# Patient Record
Sex: Female | Born: 1965 | Race: White | Hispanic: No | Marital: Married | Smoking: Never smoker
Health system: Southern US, Community
[De-identification: ages and names within clinical notes are randomized; demographics above are authoritative.]

## PROBLEM LIST (undated history)

## (undated) DIAGNOSIS — I10 Essential (primary) hypertension: Secondary | ICD-10-CM

## (undated) DIAGNOSIS — E079 Disorder of thyroid, unspecified: Secondary | ICD-10-CM

## (undated) HISTORY — PX: ABDOMINAL HYSTERECTOMY: SHX81

---

## 1998-03-24 ENCOUNTER — Emergency Department (HOSPITAL_COMMUNITY): Admission: EM | Admit: 1998-03-24 | Discharge: 1998-03-24 | Payer: Self-pay | Admitting: Emergency Medicine

## 1999-02-13 ENCOUNTER — Emergency Department (HOSPITAL_COMMUNITY): Admission: EM | Admit: 1999-02-13 | Discharge: 1999-02-13 | Payer: Self-pay | Admitting: Emergency Medicine

## 1999-05-14 ENCOUNTER — Emergency Department (HOSPITAL_COMMUNITY): Admission: EM | Admit: 1999-05-14 | Discharge: 1999-05-14 | Payer: Self-pay | Admitting: Emergency Medicine

## 2000-10-21 ENCOUNTER — Other Ambulatory Visit: Admission: RE | Admit: 2000-10-21 | Discharge: 2000-10-21 | Payer: Self-pay | Admitting: *Deleted

## 2006-07-12 ENCOUNTER — Encounter (INDEPENDENT_AMBULATORY_CARE_PROVIDER_SITE_OTHER): Payer: Self-pay | Admitting: *Deleted

## 2006-07-12 ENCOUNTER — Observation Stay (HOSPITAL_COMMUNITY): Admission: RE | Admit: 2006-07-12 | Discharge: 2006-07-14 | Payer: Self-pay | Admitting: Gynecology

## 2010-01-12 ENCOUNTER — Ambulatory Visit (HOSPITAL_COMMUNITY): Admission: RE | Admit: 2010-01-12 | Discharge: 2010-01-12 | Payer: Self-pay | Admitting: Physician Assistant

## 2010-01-12 ENCOUNTER — Encounter: Payer: Self-pay | Admitting: Family Medicine

## 2010-12-20 ENCOUNTER — Emergency Department (HOSPITAL_BASED_OUTPATIENT_CLINIC_OR_DEPARTMENT_OTHER)
Admission: EM | Admit: 2010-12-20 | Discharge: 2010-12-21 | Disposition: A | Discharge status: Home / Self Care | Payer: BC Managed Care – PPO | Attending: Emergency Medicine | Admitting: Emergency Medicine

## 2010-12-20 DIAGNOSIS — E039 Hypothyroidism, unspecified: Secondary | ICD-10-CM | POA: Insufficient documentation

## 2010-12-20 DIAGNOSIS — A499 Bacterial infection, unspecified: Secondary | ICD-10-CM | POA: Insufficient documentation

## 2010-12-20 DIAGNOSIS — N760 Acute vaginitis: Secondary | ICD-10-CM | POA: Insufficient documentation

## 2010-12-20 DIAGNOSIS — B9689 Other specified bacterial agents as the cause of diseases classified elsewhere: Secondary | ICD-10-CM | POA: Insufficient documentation

## 2010-12-20 DIAGNOSIS — N9489 Other specified conditions associated with female genital organs and menstrual cycle: Secondary | ICD-10-CM | POA: Insufficient documentation

## 2010-12-20 LAB — CBC
MCV: 89.1 fL (ref 78.0–100.0)
Platelets: 332 10*3/uL (ref 150–400)
RDW: 13.9 % (ref 11.5–15.5)
WBC: 9.1 10*3/uL (ref 4.0–10.5)

## 2010-12-20 LAB — DIFFERENTIAL
Lymphs Abs: 3.5 10*3/uL (ref 0.7–4.0)
Monocytes Relative: 7 % (ref 3–12)
Neutrophils Relative %: 50 % (ref 43–77)

## 2010-12-21 ENCOUNTER — Emergency Department (INDEPENDENT_AMBULATORY_CARE_PROVIDER_SITE_OTHER): Payer: BC Managed Care – PPO

## 2010-12-21 DIAGNOSIS — R109 Unspecified abdominal pain: Secondary | ICD-10-CM

## 2010-12-21 LAB — WET PREP, GENITAL
Clue Cells Wet Prep HPF POC: NONE SEEN
Trich, Wet Prep: NONE SEEN
Yeast Wet Prep HPF POC: NONE SEEN

## 2010-12-21 LAB — URINALYSIS, ROUTINE W REFLEX MICROSCOPIC
Glucose, UA: NEGATIVE mg/dL
Protein, ur: NEGATIVE mg/dL
Specific Gravity, Urine: 1.028 (ref 1.005–1.030)
Urobilinogen, UA: 0.2 mg/dL (ref 0.0–1.0)
pH: 7 (ref 5.0–8.0)

## 2010-12-21 LAB — COMPREHENSIVE METABOLIC PANEL
Alkaline Phosphatase: 143 U/L — ABNORMAL HIGH (ref 39–117)
BUN: 12 mg/dL (ref 6–23)
Calcium: 8.9 mg/dL (ref 8.4–10.5)
Creatinine, Ser: 0.7 mg/dL (ref 0.4–1.2)
Glucose, Bld: 99 mg/dL (ref 70–99)
Total Protein: 7.1 g/dL (ref 6.0–8.3)

## 2010-12-21 LAB — URINE MICROSCOPIC-ADD ON

## 2010-12-22 LAB — GC/CHLAMYDIA PROBE AMP, GENITAL
Chlamydia, DNA Probe: NEGATIVE
GC Probe Amp, Genital: NEGATIVE

## 2011-01-09 NOTE — H&P (Signed)
NAMEHUMA, [name]              ACCOUNT NO.:  [id number]   MEDICAL RECORD NO.:  [id number]          PATIENT TYPE:  AMB   LOCATION:  [facility]                         FACILITY:  [facility]   PHYSICIAN:  [name], M.D.DATE OF BIRTH:  20-Jul-1966   DATE OF ADMISSION:  07/12/2006  DATE OF DISCHARGE:                                HISTORY & PHYSICAL   The patient is scheduled for surgery at [facility] Monday,  November 19th, at 7:30 a.m.   CHIEF COMPLAINT:  1. Chronic left lower quadrant pain.  2. History of ovarian cyst.   HISTORY:  The patient presented to the office for preoperative consultation  November 20th.  The patient is a 45 year old gravida 3, para 3, with chronic  pelvic pain, especially of the left lower quadrant.  She had a normal CA-125  that had been ordered back in September of 2007.  Due to the fact that on  the day when she presented to the office complaining of her pelvic pain she  was noted to have a right ovarian complex cyst on ultrasound measuring 16 x  14 x 15 mm with a cystic and solid area.  The left ovary had a thin wall  complex cyst measuring 35 x 19 x 25 mm and irregular border and on the  ultrasound July 05, 2006, the ultrasound had demonstrated the right  ovarian cyst had slightly increased to 24 x 21 x 17 mm with a thick wall  with solid cystic area with avascular flow in the perimeter of the cyst.  The left ovary was normal.  The patient stated that she also has  dyspareunia, her pain is always on her left side.  She was scheduled for a  diagnostic laparoscopy initially with bilateral ovarian cystectomy, but now  that the left ovarian cyst has resolved she states that regardless she would  like to have her left tube and ovary removed at the same time as her right  ovarian cystectomy.   PAST MEDICAL HISTORY:  1. She is ALLERGIC TO CODEINE AND PENICILLIN.  2. Has a prior history of transvaginal hysterectomy.  3. She has a history  of hypothyroidism for which she is on Synthroid.  4. She smokes 1/2 pack of cigarettes daily.   PHYSICAL EXAMINATION:  The patient weighs 161 pounds.  She is 5 feet tall.  Blood pressure 118/78.  HEENT:  Unremarkable.  NECK:  Supple.  Trachea midline.  No carotid bruits, no thyromegaly.  LUNGS:  Clear to auscultation without rhonchi or wheezes.  HEART:  Regular rate and rhythm, no murmurs or gallop.  BREAST:  Not done.  PELVIC:  __________ within normal limits.  Vaginal cuff intact.  BI-MANUAL:  Questionable fullness in the right adnexa.  RECTAL:  Not done.   ASSESSMENT:  95. A 45 year old gravida 3, para 3, with chronic pelvic pain, especially      the left lower quadrant.  2. Prior history of bilateral ovarian cysts.  3. Left ovarian cyst, resolved.  4. Persistent right ovarian cyst slightly larger, questionable dermoid.   Patient requesting, regardless, to have her  left tube and ovary removed  because of her chronic left lower quadrant pain regardless of the findings.  She will also undergo a right ovarian cystectomy.  The patient is fully  aware there is the possibility she could lose both ovaries at this point.  Patient would need to be placed on hormone replacement therapy.  Also, the  potential risk of trauma to internal organs or inaccessibility of the  structure due to extensive adhesions may require an open laparotomy  technique.  Also, with a risk of infection, although she will receive  prophylaxis antibiotic, the risk for DVT with subsequent pulmonary embolism  were discussed as well and she will have PSA stockings as well.  Also,  potential trauma to internal organs with the need for emergency laparotomy  and corrective surgery.  All of these issues were discussed with the patient  and all questions were answered and will follow accordingly.   PLAN:  The patient is scheduled for diagnostic laparoscopy with left  salpingo-oophorectomy and right ovarian cystectomy,  possible right salpingo-  oophorectomy as well.  The patient scheduled for surgery Monday, November  19th, at 7:30 a.m. at [facility].      [name], M.D.  Electronically Signed     JHF/MEDQ  D:  07/09/2006  T:  07/09/2006  Job:  [id number]

## 2011-01-09 NOTE — Op Note (Signed)
[name], [name]              ACCOUNT NO.:  [id number]   MEDICAL RECORD NO.:  [id number]          PATIENT TYPE:  AMB   LOCATION:  [facility]                         [facility]ACILITY:  [facility]   PHYSICIAN:  Juan H. Lily Peer, M.D.DATE OF BIRTH:  08/29/1965   DATE OF PROCEDURE:  07/12/2006  DATE OF DISCHARGE:                                 OPERATIVE REPORT   SURGEON:  Juan H. Lily Peer, M.D.   FIRST ASSISTANT:  Edyth Gunnels, MD   INDICATIONS FOR OPERATION:  45 year old gravida 3, para 3 with chronic  pelvic pain, left greater than right, history of ovarian cyst, right cyst  persistent, left cyst resolved.  The patient requesting removal of left tube  and ovary regardless of findings.   PREOPERATIVE DIAGNOSES:  1. Chronic left lower quadrant pain.  2. Ovarian cyst.   POSTOPERATIVE DIAGNOSES:  1. Chronic left lower quadrant pain.  2. Ovarian cyst.   ANESTHESIA:  General endotracheal anesthesia.   PROCEDURES PERFORMED:  1. Diagnostic laparoscopy.  2. Exploratory laparotomy.  3. Pelvic adhesiolysis.  4. Left salpingo-oophorectomy.   FINDINGS:  The patient had extensive omental adhesions during laparoscopy  making it impossible to proceed with the operation laparoscopically so  laparotomy had been done.  The patient had omentum adhered the anterior  abdominal wall and pelvic sidewalls that were evident.  Her left tube and  ovary were adhered to the pelvic sidewall, evidence of previous hysterectomy  and her right ovary had a small hemorrhagic cyst.   DESCRIPTION OF OPERATION:  After the patient was adequately counseled, she  was taken to the operating room where she underwent successful general  endotracheal anesthesia.  She received a gram of cefoxitin IV for  prophylaxis, had PSA stockings for DVT prophylaxis and a Foley catheter was  in place.  After abdomen was prepped and draped in usual sterile fashion, a  small stab incision was made under the umbilicus and with the OptiVu  and  direct visualization the peritoneal cavity was entered.  The laparoscope was  inserted and it was evident after the pneumoperitoneum was established there  was extensive abdominal pelvic adhesions and omentum adhered to the anterior  wall, making very small window to visualize pelvic cavity, it was decided to  proceed with a laparotomy technique.  The scope was removed.  The fascia  from the umbilicus was closed with 0 Vicryl suture and skin was  reapproximated with subcuticular stitch of 4-0 plain catgut suture.  The  Pfannenstiel skin incision was made 2 cm above the symphysis pubis.  The  incision was carried down through skin, subcutaneous tissue down to the  rectus fascia.  A midline nick was made.  The fascia was incised in  transverse fashion.  The peritoneal was entered cautiously and the Lenox Ahr was placed.  It was noted that omentum was adhered to the  anterior abdominal wall requiring meticulous dissection to free some of this  areas to be able to see the pelvic cavity.  Once this was accomplished, the  retractors were in place and the bowel was moved in a cephalic manner to be  able to visualize the pelvic cavity.  It was noted then the left tube and  ovary was adhered to the pelvic sidewall.  Meticulous dissection was  required to free the ovary from the pelvic sidewall.  The left ureter was  identified.  The round ligament was transected with a Bovie cautery and the  left infundibulopelvic limb was identified, was clamped, cut and free tied  with 0 Vicryl suture x2 and the remaining left tube and ovary was passed off  the operative field.  Surgicel was then used for additional hemostasis on  the peritoneum surface.  The right ovary was identified.  A small incision  was made over the capsule.  It appeared that what was being visualized on  the ultrasound was a hemorrhagic cyst.  No suspicious for malignancy.  Due  to some mild bleeding that was present,  running stitch of 4-0 Vicryl suture  in a locking stitch manner on the capsule was utilized for hemostasis and  Surgicel was used as well for hemostasis after the pelvic cavity had been  copiously irrigated with normal saline solution.  Sponge and needle count  were correct.  The O'Connor-O'Sullivan retractor was removed, the visceral  peritoneum was not reapproximated but the fascia was closed with running  stitch of 0 Vicryl suture.  The subcutaneous bleeders were Bovie cauterized.  Skin was reapproximated with skin clips followed by placing Xeroform gauze  and 4 x dressing.  Of note quarter percent Marcaine was infiltrated the  subumbilical and Pfannenstiel incision site for postoperative analgesia for  a total of 10 mL.  Blood loss was less than 100 mL and IV fluid 1800 mL of  lactated Ringer's.      Juan H. Lily Peer, M.D.  Electronically Signed     JHF/MEDQ  D:  07/12/2006  T:  07/12/2006  Job:  [id number]

## 2011-01-09 NOTE — Discharge Summary (Signed)
NAMEJANYIA, [name]              ACCOUNT NO.:  [id number]   MEDICAL RECORD NO.:  [id number]          PATIENT TYPE:  INP   LOCATION:  1506                         FACILITY:  [facility]   PHYSICIAN:  Juan H. Lily Peer, M.D.DATE OF BIRTH:  12/27/1965   DATE OF ADMISSION:  07/12/2006  DATE OF DISCHARGE:  07/14/2006                                 DISCHARGE SUMMARY   Total days hospitalized were two.   HISTORY:  The patient is a 45 year old gravida 3, para 3 with chronic left  lower quadrant pain.  Was taken to the operating room on the morning of  November 19th for diagnostic laparoscopy resulting in exploratory laparotomy  secondary to extensive pelvic adhesions.  She underwent pelvic adhesiolysis  and left-sided salpingo-oophorectomy.  She had a right small hemorrhagic  cyst, but the rest of the pelvic cavity was normal.  She had a previous  history of hysterectomy for benign entity.   Her postoperative stay consisted of a hemoglobin and hematocrit on the first  postoperative day of 12.9 and 36.5 respectively.  Adequate urine output.  Foley catheter was discontinued for 24 hours, status post PCA pump.  She was  advanced from a clear-liquid diet to a regular diet.  She was up and  ambulating and passing gas and was ready to be discharged home on her second  postoperative day.   FINAL DIAGNOSES:  1. Chronic left lower quadrant pain.  2. Pelvic adhesions.   PROCEDURES PERFORMED:  1. Diagnostic laparoscopy.  2. Exploratory laparotomy.  3. Pelvic adhesiolysis.  4. Left salpingo-oophorectomy.   FINAL DISPOSITION/FOLLOWUP:  Patient was discharged home on her second  postoperative day.  She was to return back to the office in a few days to  have her staples removed.  She was given a prescription for Darvocet to take  1 p.o. q.4-6h. p.r.n. pain and Reglan 10 mg 1 p.o. q.4-6h. p.r.n. nausea.      Juan H. Lily Peer, M.D.  Electronically Signed     JHF/MEDQ  D:  07/14/2006  T:   07/14/2006  Job:  [id number]

## 2012-12-28 ENCOUNTER — Encounter (HOSPITAL_BASED_OUTPATIENT_CLINIC_OR_DEPARTMENT_OTHER): Payer: Self-pay | Admitting: *Deleted

## 2012-12-28 ENCOUNTER — Emergency Department (HOSPITAL_BASED_OUTPATIENT_CLINIC_OR_DEPARTMENT_OTHER)
Admission: EM | Admit: 2012-12-28 | Discharge: 2012-12-28 | Disposition: A | Discharge status: Home / Self Care | Payer: BC Managed Care – PPO | Attending: Emergency Medicine | Admitting: Emergency Medicine

## 2012-12-28 DIAGNOSIS — N390 Urinary tract infection, site not specified: Secondary | ICD-10-CM

## 2012-12-28 DIAGNOSIS — E079 Disorder of thyroid, unspecified: Secondary | ICD-10-CM | POA: Insufficient documentation

## 2012-12-28 DIAGNOSIS — Z880 Allergy status to penicillin: Secondary | ICD-10-CM | POA: Insufficient documentation

## 2012-12-28 DIAGNOSIS — Z79899 Other long term (current) drug therapy: Secondary | ICD-10-CM | POA: Insufficient documentation

## 2012-12-28 DIAGNOSIS — I10 Essential (primary) hypertension: Secondary | ICD-10-CM | POA: Insufficient documentation

## 2012-12-28 HISTORY — DX: Disorder of thyroid, unspecified: E07.9

## 2012-12-28 HISTORY — DX: Essential (primary) hypertension: I10

## 2012-12-28 LAB — URINALYSIS, ROUTINE W REFLEX MICROSCOPIC
Bilirubin Urine: NEGATIVE
Nitrite: NEGATIVE
Specific Gravity, Urine: 1.025 (ref 1.005–1.030)
Urobilinogen, UA: 0.2 mg/dL (ref 0.0–1.0)

## 2012-12-28 LAB — URINE MICROSCOPIC-ADD ON

## 2012-12-28 MED ORDER — NITROFURANTOIN MONOHYD MACRO 100 MG PO CAPS: 100.0000 mg | ORAL_CAPSULE | Freq: Two times a day (BID) | ORAL | Status: DC

## 2012-12-28 MED ORDER — OXYCODONE-ACETAMINOPHEN 5-325 MG PO TABS
1.0000 | ORAL_TABLET | Freq: Once | ORAL | Status: AC
  Administered 2012-12-28: 1 via ORAL
  Filled 2012-12-28 (×2): qty 1

## 2012-12-28 MED ORDER — OXYCODONE-ACETAMINOPHEN 5-325 MG PO TABS: 2.0000 | ORAL_TABLET | ORAL | Status: DC | PRN

## 2012-12-28 NOTE — ED Provider Notes (Signed)
Medical screening examination/treatment/procedure(s) were performed by non-physician practitioner and as supervising physician I was immediately available for consultation/collaboration.  Melani Brisbane, MD 12/28/12 1807 

## 2012-12-28 NOTE — ED Provider Notes (Signed)
History     CSN: [id number]  Arrival date & time 12/28/12  1527   First MD Initiated Contact with Patient 12/28/12 1544      Chief Complaint  Patient presents with  . Hematuria    (Consider location/radiation/quality/duration/timing/severity/associated sxs/prior treatment) HPI Comments: Pt states that she is having blood in urine:no discharge:pt states that the urine has an odor  Patient is a 47 y.o. female presenting with hematuria. The history is provided by the patient. No language interpreter was used.  Hematuria This is a new problem. The current episode started in the past 7 days. The problem occurs constantly. The problem has been gradually worsening. Associated symptoms include urinary symptoms. Pertinent negatives include no abdominal pain, fever or vomiting. Nothing aggravates the symptoms. She has tried nothing for the symptoms.    Past Medical History  Diagnosis Date  . Hypertension   . Thyroid disease     Past Surgical History  Procedure Laterality Date  . Abdominal hysterectomy      History reviewed. No pertinent family history.  History  Substance Use Topics  . Smoking status: Never Smoker   . Smokeless tobacco: Not on file  . Alcohol Use: No    OB History   Grav Para Term Preterm Abortions TAB SAB Ect Mult Living                  Review of Systems  Constitutional: Negative for fever.  Respiratory: Negative.   Cardiovascular: Negative.   Gastrointestinal: Negative for vomiting and abdominal pain.  Genitourinary: Positive for hematuria.    Allergies  Penicillins and Codeine  Home Medications   Current Outpatient Rx  Name  Route  Sig  Dispense  Refill  . levothyroxine (SYNTHROID, LEVOTHROID) 125 MCG tablet   Oral   Take 125 mcg by mouth daily before breakfast.         . rosuvastatin (CRESTOR) 20 MG tablet   Oral   Take 20 mg by mouth daily.         . nitrofurantoin, macrocrystal-monohydrate, (MACROBID) 100 MG capsule   Oral  Take 1 capsule (100 mg total) by mouth 2 (two) times daily.   14 capsule   0   . oxyCODONE-acetaminophen (PERCOCET/ROXICET) 5-325 MG per tablet   Oral   Take 2 tablets by mouth every 4 (four) hours as needed for pain.   6 tablet   0     BP 178/91  Pulse 96  Temp(Src) 98.2 F (36.8 C) (Oral)  Resp 18  Ht 5' (1.524 m)  Wt 188 lb (85.276 kg)  BMI 36.72 kg/m2  SpO2 95%  Physical Exam  Nursing note and vitals reviewed. Constitutional: She is oriented to person, place, and time. She appears well-developed and well-nourished.  Cardiovascular: Normal rate and regular rhythm.   Pulmonary/Chest: Effort normal and breath sounds normal.  Abdominal: Soft. Bowel sounds are normal. There is no tenderness.  Musculoskeletal: Normal range of motion.  Neurological: She is alert and oriented to person, place, and time.  Skin: Skin is warm and dry.  Psychiatric: She has a normal mood and affect.    ED Course  Procedures (including critical care time)  Labs Reviewed  URINALYSIS, ROUTINE W REFLEX MICROSCOPIC - Abnormal; Notable for the following:    APPearance CLOUDY (*)    Leukocytes, UA SMALL (*)    All other components within normal limits  URINE MICROSCOPIC-ADD ON - Abnormal; Notable for the following:    Squamous Epithelial / LPF FEW (*)  All other components within normal limits  URINE CULTURE   No results found.   1. UTI (lower urinary tract infection)       MDM  Will treat for a simple WUJ:[id number] sent        Teressa Lower, NP 12/28/12 1614

## 2012-12-28 NOTE — ED Notes (Signed)
Pt c/o lower abd pain with blood in urine  X 1 week

## 2012-12-31 LAB — URINE CULTURE

## 2013-01-01 ENCOUNTER — Telehealth (HOSPITAL_COMMUNITY): Payer: Self-pay | Admitting: Emergency Medicine

## 2013-01-01 NOTE — ED Notes (Signed)
Post ED Visit - Positive Culture Follow-up  Culture report reviewed by antimicrobial stewardship pharmacist: []  Wes Dulaney, Pharm.D., BCPS []  Celedonio Miyamoto, Pharm.D., BCPS [x]  Georgina Pillion, Pharm.D., BCPS []  [place]ace, [address].D., BCPS, AAHIVP []  Estella Husk, Pharm.D., BCPS, AAHIV  Positive urine culture Treated with Macrobid, organism sensitive to the same and no further patient follow-up is required at this time.  Kylie A Holland 01/01/2013, 4:25 PM

## 2017-02-17 ENCOUNTER — Other Ambulatory Visit: Payer: Self-pay | Admitting: Student

## 2017-02-17 DIAGNOSIS — Z1239 Encounter for other screening for malignant neoplasm of breast: Secondary | ICD-10-CM

## 2020-05-06 ENCOUNTER — Encounter (HOSPITAL_BASED_OUTPATIENT_CLINIC_OR_DEPARTMENT_OTHER): Payer: Self-pay

## 2020-05-06 ENCOUNTER — Emergency Department (HOSPITAL_BASED_OUTPATIENT_CLINIC_OR_DEPARTMENT_OTHER): Payer: Managed Care, Other (non HMO)

## 2020-05-06 ENCOUNTER — Emergency Department (HOSPITAL_BASED_OUTPATIENT_CLINIC_OR_DEPARTMENT_OTHER)
Admission: EM | Admit: 2020-05-06 | Discharge: 2020-05-06 | Disposition: A | Discharge status: Home / Self Care | Payer: Managed Care, Other (non HMO) | Attending: Emergency Medicine | Admitting: Emergency Medicine

## 2020-05-06 ENCOUNTER — Other Ambulatory Visit: Payer: Self-pay

## 2020-05-06 DIAGNOSIS — Z79899 Other long term (current) drug therapy: Secondary | ICD-10-CM | POA: Diagnosis not present

## 2020-05-06 DIAGNOSIS — I10 Essential (primary) hypertension: Secondary | ICD-10-CM | POA: Insufficient documentation

## 2020-05-06 DIAGNOSIS — U071 COVID-19: Secondary | ICD-10-CM | POA: Diagnosis not present

## 2020-05-06 DIAGNOSIS — R05 Cough: Secondary | ICD-10-CM | POA: Diagnosis present

## 2020-05-06 LAB — BASIC METABOLIC PANEL
Anion gap: 13 (ref 5–15)
BUN: 16 mg/dL (ref 6–20)
CO2: 23 mmol/L (ref 22–32)
Calcium: 9.2 mg/dL (ref 8.9–10.3)
Chloride: 101 mmol/L (ref 98–111)
Creatinine, Ser: 0.88 mg/dL (ref 0.44–1.00)
GFR calc Af Amer: >60 mL/min (ref >60)
GFR calc non Af Amer: >60 mL/min (ref >60)
Glucose, Bld: 102 mg/dL — ABNORMAL HIGH (ref 70–99)
Potassium: 3.4 mmol/L — ABNORMAL LOW (ref 3.5–5.1)
Sodium: 137 mmol/L (ref 135–145)

## 2020-05-06 LAB — CBC WITH DIFFERENTIAL/PLATELET
Abs Immature Granulocytes: 0.01 10*3/uL (ref 0.00–0.07)
Basophils Absolute: 0 10*3/uL (ref 0.0–0.1)
Basophils Relative: 0 %
Eosinophils Absolute: 0 10*3/uL (ref 0.0–0.5)
Eosinophils Relative: 1 %
HCT: 42.3 % (ref 36.0–46.0)
Hemoglobin: 14.4 g/dL (ref 12.0–15.0)
Immature Granulocytes: 0 %
Lymphocytes Relative: 30 %
Lymphs Abs: 1.2 10*3/uL (ref 0.7–4.0)
MCH: 30.8 pg (ref 26.0–34.0)
MCHC: 34 g/dL (ref 30.0–36.0)
MCV: 90.6 fL (ref 80.0–100.0)
Monocytes Absolute: 0.3 10*3/uL (ref 0.1–1.0)
Monocytes Relative: 7 %
Neutro Abs: 2.5 10*3/uL (ref 1.7–7.7)
Neutrophils Relative %: 62 %
Platelets: 241 10*3/uL (ref 150–400)
RBC: 4.67 MIL/uL (ref 3.87–5.11)
RDW: 13.9 % (ref 11.5–15.5)
Smear Review: NORMAL
WBC: 4.1 10*3/uL (ref 4.0–10.5)
nRBC: 0 % (ref 0.0–0.2)

## 2020-05-06 LAB — SARS CORONAVIRUS 2 BY RT PCR (HOSPITAL ORDER, PERFORMED IN ~~LOC~~ HOSPITAL LAB): SARS Coronavirus 2: POSITIVE — AB

## 2020-05-06 MED ORDER — DIPHENHYDRAMINE HCL 50 MG/ML IJ SOLN: 50.0000 mg | Freq: Once | INTRAMUSCULAR | Status: DC | PRN

## 2020-05-06 MED ORDER — BENZONATATE 100 MG PO CAPS: 100.0000 mg | ORAL_CAPSULE | Freq: Three times a day (TID) | ORAL | 0 refills | Status: AC

## 2020-05-06 MED ORDER — ALBUTEROL SULFATE HFA 108 (90 BASE) MCG/ACT IN AERS
2.0000 | INHALATION_SPRAY | Freq: Once | RESPIRATORY_TRACT | Status: AC
  Administered 2020-05-06: 2 via RESPIRATORY_TRACT
  Filled 2020-05-06: qty 6.7

## 2020-05-06 MED ORDER — EPINEPHRINE 0.3 MG/0.3ML IJ SOAJ: 0.3000 mg | Freq: Once | INTRAMUSCULAR | Status: DC | PRN

## 2020-05-06 MED ORDER — FLUTICASONE PROPIONATE 50 MCG/ACT NA SUSP: 2.0000 | Freq: Every day | NASAL | 0 refills | Status: DC

## 2020-05-06 MED ORDER — BENZONATATE 100 MG PO CAPS: 100.0000 mg | ORAL_CAPSULE | Freq: Three times a day (TID) | ORAL | 0 refills | Status: DC

## 2020-05-06 MED ORDER — METHYLPREDNISOLONE SODIUM SUCC 125 MG IJ SOLR: 125.0000 mg | Freq: Once | INTRAMUSCULAR | Status: DC | PRN

## 2020-05-06 MED ORDER — IOHEXOL 350 MG/ML SOLN
100.0000 mL | Freq: Once | INTRAVENOUS | Status: AC | PRN
  Administered 2020-05-06: 100 mL via INTRAVENOUS

## 2020-05-06 MED ORDER — SODIUM CHLORIDE 0.9 % IV SOLN: INTRAVENOUS | Status: DC | PRN

## 2020-05-06 MED ORDER — ALBUTEROL SULFATE HFA 108 (90 BASE) MCG/ACT IN AERS: 2.0000 | INHALATION_SPRAY | Freq: Once | RESPIRATORY_TRACT | Status: DC | PRN

## 2020-05-06 MED ORDER — FAMOTIDINE IN NACL 20-0.9 MG/50ML-% IV SOLN: 20.0000 mg | Freq: Once | INTRAVENOUS | Status: DC | PRN

## 2020-05-06 MED ORDER — SODIUM CHLORIDE 0.9 % IV SOLN
1200.0000 mg | Freq: Once | INTRAVENOUS | Status: AC
  Administered 2020-05-06: 1200 mg via INTRAVENOUS
  Filled 2020-05-06: qty 10

## 2020-05-06 NOTE — ED Triage Notes (Signed)
Pt c/o flu sx started 9/8-states she had covid test yesterday with results pending-NAD-steady gait

## 2020-05-06 NOTE — Discharge Instructions (Addendum)
Take 2 puffs of the albuterol inhaler as needed every 4-6 hours.  You are also given fluticasone nasal spray for your congestion and Tessalon Perles for your cough.  Use as directed.  Please follow up with your primary care provider within 5-7 days for re-evaluation of your symptoms. If you do not have a primary care provider, information for a healthcare clinic has been provided for you to make arrangements for follow up care. Please return to the emergency department for any new or worsening symptoms.

## 2020-05-06 NOTE — ED Provider Notes (Signed)
[facility] EMERGENCY DEPARTMENT Provider Note   CSN: [id number] Arrival date & time: 05/06/20  1222     History Chief Complaint  Patient presents with  . Cough    + covid    [name] is a 53 y.o. female.  HPI   54 year old female with a history of hypertension, thyroid disease, who presents to the emergency department today for evaluation of URI systems.  Patient states that about a week ago she started having congestion, cough, nausea, diarrhea, and loss of taste and smell.  She also has started experiencing some shortness of breath over the last few days.  She has been taking over-the-counter medications to help with her symptoms.  She has not been vaccinated.  Past Medical History:  Diagnosis Date  . Hypertension   . Thyroid disease     There are no problems to display for this patient.   Past Surgical History:  Procedure Laterality Date  . ABDOMINAL HYSTERECTOMY       OB History   No obstetric history on file.     No family history on file.  Social History   Tobacco Use  . Smoking status: Never Smoker  . Smokeless tobacco: Never Used  Vaping Use  . Vaping Use: Never used  Substance Use Topics  . Alcohol use: No  . Drug use: No    Home Medications Prior to Admission medications   Medication Sig Start Date End Date Taking? Authorizing Provider  benzonatate (TESSALON) 100 MG capsule Take 1 capsule (100 mg total) by mouth every 8 (eight) hours for 5 days. 05/06/20 05/11/20  Lukus Binion S, PA-C  fluticasone (FLONASE) 50 MCG/ACT nasal spray Place 2 sprays into both nostrils daily. 05/06/20   Blondine Hottel S, PA-C  levothyroxine (SYNTHROID, LEVOTHROID) 125 MCG tablet Take 125 mcg by mouth daily before breakfast.    [provider]  nitrofurantoin, macrocrystal-monohydrate, (MACROBID) 100 MG capsule Take 1 capsule (100 mg total) by mouth 2 (two) times daily. 12/28/12   Teressa Lower, NP  oxyCODONE-acetaminophen  (PERCOCET/ROXICET) 5-325 MG per tablet Take 2 tablets by mouth every 4 (four) hours as needed for pain. 12/28/12   Teressa Lower, NP  rosuvastatin (CRESTOR) 20 MG tablet Take 20 mg by mouth daily.    [provider]    Allergies    Penicillins and Codeine  Review of Systems   Review of Systems  Constitutional: Negative for fever.  HENT: Positive for congestion. Negative for ear pain and sore throat.        Loss of taste and smell  Eyes: Negative for visual disturbance.  Respiratory: Positive for cough and shortness of breath.   Cardiovascular: Negative for chest pain and palpitations.  Gastrointestinal: Positive for diarrhea and nausea. Negative for abdominal pain and vomiting.  Genitourinary: Negative for dysuria and hematuria.  Musculoskeletal: Positive for myalgias.  Skin: Negative for rash.  Neurological: Positive for headaches.  All other systems reviewed and are negative.   Physical Exam Updated Vital Signs BP 139/81 (BP Location: Right Arm)   Pulse 99   Temp 99 F (37.2 C) (Oral)   Resp 16   Ht 5' (1.524 m)   Wt 83.5 kg   SpO2 99%   BMI 35.94 kg/m   Physical Exam Vitals and nursing note reviewed.  Constitutional:      General: She is not in acute distress.    Appearance: She is well-developed.  HENT:     Head: Normocephalic and atraumatic.  Eyes:  Conjunctiva/sclera: Conjunctivae normal.  Cardiovascular:     Rate and Rhythm: Normal rate and regular rhythm.     Heart sounds: Normal heart sounds. No murmur heard.   Pulmonary:     Effort: Pulmonary effort is normal. No respiratory distress.     Breath sounds: Normal breath sounds. No wheezing or rales.  Abdominal:     General: Bowel sounds are normal.     Palpations: Abdomen is soft.     Tenderness: There is no abdominal tenderness. There is no guarding or rebound.  Musculoskeletal:     Cervical back: Neck supple.  Skin:    General: Skin is warm and dry.  Neurological:     Mental Status:  She is alert.     ED Results / Procedures / Treatments   Labs (all labs ordered are listed, but only abnormal results are displayed) Labs Reviewed  SARS CORONAVIRUS 2 BY RT PCR (HOSPITAL ORDER, PERFORMED IN [facility]) - Abnormal; Notable for the following components:      Result Value   SARS Coronavirus 2 POSITIVE (*)    All other components within normal limits  BASIC METABOLIC PANEL - Abnormal; Notable for the following components:   Potassium 3.4 (*)    Glucose, Bld 102 (*)    All other components within normal limits  CBC WITH DIFFERENTIAL/PLATELET    EKG None  Radiology CT Angio Chest PE W and/or Wo Contrast  Result Date: 05/06/2020 CLINICAL DATA:  COVID-19 positive.  Possible pulmonary embolus. EXAM: CT ANGIOGRAPHY CHEST WITH CONTRAST TECHNIQUE: Multidetector CT imaging of the chest was performed using the standard protocol during bolus administration of intravenous contrast. Multiplanar CT image reconstructions and MIPs were obtained to evaluate the vascular anatomy. CONTRAST:  OMNIPAQUE IOHEXOL 350 MG/ML SOLN COMPARISON:  Radiograph same day. FINDINGS: Cardiovascular: Satisfactory opacification of the pulmonary arteries to the segmental level. No evidence of pulmonary embolism. Normal heart size. No pericardial effusion. Mediastinum/Nodes: No enlarged mediastinal, hilar, or axillary lymph nodes. Thyroid gland, trachea, and esophagus demonstrate no significant findings. Lungs/Pleura: No pneumothorax or pleural effusion is noted. Patchy airspace opacities are noted throughout both lungs concerning for multifocal pneumonia, most likely due to COVID-19. Upper Abdomen: No acute abnormality. Musculoskeletal: No chest wall abnormality. No acute or significant osseous findings. Review of the MIP images confirms the above findings. IMPRESSION: 1. No definite evidence of pulmonary embolus. 2. Patchy airspace opacities are noted throughout both lungs concerning for  multifocal pneumonia, most likely due to COVID-19. Electronically Signed   By: Lupita Raider M.D.   On: 05/06/2020 18:12   DG Chest Portable 1 View  Result Date: 05/06/2020 CLINICAL DATA:  Cough. EXAM: PORTABLE CHEST 1 VIEW COMPARISON:  None. FINDINGS: The heart size and mediastinal contours are within normal limits. A possible 3.7 cm airspace opacity is seen in the right upper lung. The left lung is clear. There is no pleural effusion or pneumothorax. The visualized skeletal structures are unremarkable. IMPRESSION: Possible airspace opacity in the right upper lung may represent a focal infiltrate versus pulmonary nodule/mass. Chest CT could be performed for further evaluation. Electronically Signed   By: Romona Curls M.D.   On: 05/06/2020 15:12    Procedures Procedures (including critical care time)  Medications Ordered in ED Medications  0.9 %  sodium chloride infusion (has no administration in time range)  diphenhydrAMINE (BENADRYL) injection 50 mg (has no administration in time range)  famotidine (PEPCID) IVPB 20 mg premix (has no administration in time  range)  methylPREDNISolone sodium succinate (SOLU-MEDROL) 125 mg/2 mL injection 125 mg (has no administration in time range)  albuterol (VENTOLIN HFA) 108 (90 Base) MCG/ACT inhaler 2 puff (has no administration in time range)  EPINEPHrine (EPI-PEN) injection 0.3 mg (has no administration in time range)  albuterol (VENTOLIN HFA) 108 (90 Base) MCG/ACT inhaler 2 puff (2 puffs Inhalation Given 05/06/20 1705)  casirivimab-imdevimab (REGEN-COV) 1,200 mg in sodium chloride 0.9 % 110 mL IVPB (0 mg Intravenous Stopped 05/06/20 1718)  iohexol (OMNIPAQUE) 350 MG/ML injection 100 mL (100 mLs Intravenous Contrast Given 05/06/20 1741)    ED Course  I have reviewed the triage vital signs and the nursing notes.  Pertinent labs & imaging results that were available during my care of the patient were reviewed by me and considered in my medical decision  making (see chart for details).    MDM Rules/Calculators/A&P                          53 year old female presenting for evaluation of URI symptoms for the last week.  Is complaining of shortness of breath now as well.  Patient satting well on room air on my evaluation.  I personally ambulated her in the room and sats did not drop below 95%.  Her exam is reassuring.  Covid test is positive.  Chest x-ray reviewed/interpreted which shows   abnormality to the right upper lobe concerning for possible infiltrate versus mass.  Will obtain labs and CTA of the chest for further evaluation.    CTA chest reviewed/interpreted -  1. No definite evidence of pulmonary embolus. 2. Patchy airspace opacities are noted throughout both lungs concerning for multifocal pneumonia, most likely due to COVID-19.   She does have a history of obesity and qualifies for Mab infusion. after discussion of risk/benefits patient consented to infusion which she tolerated without difficulty or complaint.   Will give rx for symptomatic meds for her covid. Advised close f/u with pcp and strict return precautions. She voices understanding of the plan and reasons to return. All questions answered, pt stable for discharge.   [name] was evaluated in Emergency Department on 05/06/2020 for the symptoms described in the history of present illness. She was evaluated in the context of the global COVID-19 pandemic, which necessitated consideration that the patient might be at risk for infection with the SARS-CoV-2 virus that causes COVID-19. Institutional protocols and algorithms that pertain to the evaluation of patients at risk for COVID-19 are in a state of rapid change based on information released by regulatory bodies including the CDC and federal and state organizations. These policies and algorithms were followed during the patient's care in the ED.   Final Clinical Impression(s) / ED Diagnoses Final diagnoses:  COVID-19     Rx / DC Orders ED Discharge Orders         Ordered    fluticasone (FLONASE) 50 MCG/ACT nasal spray  Daily        05/06/20 1752    benzonatate (TESSALON) 100 MG capsule  Every 8 hours        05/06/20 1752           Abhishek Levesque S, PA-C 05/06/20 1826    Sabas Sous, MD 05/06/20 2318

## 2020-05-06 NOTE — ED Notes (Signed)
Denies any discomfort from mAb infusion.

## 2020-10-14 ENCOUNTER — Observation Stay (HOSPITAL_BASED_OUTPATIENT_CLINIC_OR_DEPARTMENT_OTHER)
Admission: EM | Admit: 2020-10-14 | Discharge: 2020-10-16 | Disposition: A | Discharge status: Home / Self Care | Payer: Managed Care, Other (non HMO) | Attending: Family Medicine | Admitting: Family Medicine

## 2020-10-14 ENCOUNTER — Other Ambulatory Visit: Payer: Self-pay

## 2020-10-14 ENCOUNTER — Encounter (HOSPITAL_BASED_OUTPATIENT_CLINIC_OR_DEPARTMENT_OTHER): Payer: Self-pay | Admitting: Radiology

## 2020-10-14 ENCOUNTER — Emergency Department (HOSPITAL_BASED_OUTPATIENT_CLINIC_OR_DEPARTMENT_OTHER): Payer: Managed Care, Other (non HMO)

## 2020-10-14 DIAGNOSIS — E876 Hypokalemia: Secondary | ICD-10-CM | POA: Insufficient documentation

## 2020-10-14 DIAGNOSIS — Z79899 Other long term (current) drug therapy: Secondary | ICD-10-CM | POA: Insufficient documentation

## 2020-10-14 DIAGNOSIS — E785 Hyperlipidemia, unspecified: Secondary | ICD-10-CM | POA: Diagnosis present

## 2020-10-14 DIAGNOSIS — H538 Other visual disturbances: Principal | ICD-10-CM | POA: Insufficient documentation

## 2020-10-14 DIAGNOSIS — G459 Transient cerebral ischemic attack, unspecified: Secondary | ICD-10-CM | POA: Diagnosis present

## 2020-10-14 DIAGNOSIS — R519 Headache, unspecified: Secondary | ICD-10-CM | POA: Diagnosis not present

## 2020-10-14 DIAGNOSIS — E039 Hypothyroidism, unspecified: Secondary | ICD-10-CM | POA: Diagnosis not present

## 2020-10-14 DIAGNOSIS — H5500 Unspecified nystagmus: Secondary | ICD-10-CM

## 2020-10-14 DIAGNOSIS — Z20822 Contact with and (suspected) exposure to covid-19: Secondary | ICD-10-CM | POA: Insufficient documentation

## 2020-10-14 LAB — CBC WITH DIFFERENTIAL/PLATELET
Abs Immature Granulocytes: 0.01 10*3/uL (ref 0.00–0.07)
Basophils Absolute: 0 10*3/uL (ref 0.0–0.1)
Basophils Relative: 0 %
Eosinophils Absolute: 0.4 10*3/uL (ref 0.0–0.5)
Eosinophils Relative: 4 %
HCT: 42.8 % (ref 36.0–46.0)
Hemoglobin: 14.1 g/dL (ref 12.0–15.0)
Immature Granulocytes: 0 %
Lymphocytes Relative: 39 %
Lymphs Abs: 3.7 10*3/uL (ref 0.7–4.0)
MCH: 30.7 pg (ref 26.0–34.0)
MCHC: 32.9 g/dL (ref 30.0–36.0)
MCV: 93.2 fL (ref 80.0–100.0)
Monocytes Absolute: 0.6 10*3/uL (ref 0.1–1.0)
Monocytes Relative: 6 %
Neutro Abs: 4.9 10*3/uL (ref 1.7–7.7)
Neutrophils Relative %: 51 %
Platelets: 331 10*3/uL (ref 150–400)
RBC: 4.59 MIL/uL (ref 3.87–5.11)
RDW: 14.2 % (ref 11.5–15.5)
WBC: 9.6 10*3/uL (ref 4.0–10.5)
nRBC: 0 % (ref 0.0–0.2)

## 2020-10-14 LAB — RAPID URINE DRUG SCREEN, HOSP PERFORMED
Amphetamines: NOT DETECTED
Barbiturates: NOT DETECTED
Benzodiazepines: NOT DETECTED
Cocaine: NOT DETECTED
Opiates: NOT DETECTED
Tetrahydrocannabinol: NOT DETECTED

## 2020-10-14 LAB — URINALYSIS, ROUTINE W REFLEX MICROSCOPIC
Bilirubin Urine: NEGATIVE
Glucose, UA: NEGATIVE mg/dL
Hgb urine dipstick: NEGATIVE
Ketones, ur: NEGATIVE mg/dL
Leukocytes,Ua: NEGATIVE
Nitrite: NEGATIVE
Protein, ur: NEGATIVE mg/dL
Specific Gravity, Urine: 1.015 (ref 1.005–1.030)
pH: 6 (ref 5.0–8.0)

## 2020-10-14 LAB — PROTIME-INR
INR: 1 (ref 0.8–1.2)
Prothrombin Time: 13.2 seconds (ref 11.4–15.2)

## 2020-10-14 LAB — BASIC METABOLIC PANEL
Anion gap: 9 (ref 5–15)
BUN: 11 mg/dL (ref 6–20)
CO2: 25 mmol/L (ref 22–32)
Calcium: 9.3 mg/dL (ref 8.9–10.3)
Chloride: 106 mmol/L (ref 98–111)
Creatinine, Ser: 0.74 mg/dL (ref 0.44–1.00)
GFR, Estimated: >60 mL/min (ref >60)
Glucose, Bld: 93 mg/dL (ref 70–99)
Potassium: 3.1 mmol/L — ABNORMAL LOW (ref 3.5–5.1)
Sodium: 140 mmol/L (ref 135–145)

## 2020-10-14 LAB — APTT: aPTT: 34 seconds (ref 24–36)

## 2020-10-14 LAB — RESP PANEL BY RT-PCR (FLU A&B, COVID) ARPGX2
Influenza A by PCR: NEGATIVE
Influenza B by PCR: NEGATIVE
SARS Coronavirus 2 by RT PCR: NEGATIVE

## 2020-10-14 LAB — PREGNANCY, URINE: Preg Test, Ur: NEGATIVE

## 2020-10-14 LAB — ETHANOL: Alcohol, Ethyl (B): <10 mg/dL (ref <10)

## 2020-10-14 MED ORDER — IOHEXOL 350 MG/ML SOLN
100.0000 mL | Freq: Once | INTRAVENOUS | Status: AC | PRN
  Administered 2020-10-14: 100 mL via INTRAVENOUS

## 2020-10-14 MED ORDER — MECLIZINE HCL 25 MG PO TABS: 25.0000 mg | ORAL_TABLET | Freq: Once | ORAL | Status: DC

## 2020-10-14 MED ORDER — ONDANSETRON HCL 4 MG/2ML IJ SOLN
4.0000 mg | Freq: Once | INTRAMUSCULAR | Status: AC
  Administered 2020-10-14: 4 mg via INTRAVENOUS
  Filled 2020-10-14: qty 2

## 2020-10-14 MED ORDER — ASPIRIN 81 MG PO CHEW
324.0000 mg | CHEWABLE_TABLET | Freq: Once | ORAL | Status: AC
  Administered 2020-10-14: 324 mg via ORAL
  Filled 2020-10-14: qty 4

## 2020-10-14 NOTE — ED Notes (Signed)
Patient given crackers and soda of choice. Denies needs at this time. No acute distress noted.

## 2020-10-14 NOTE — ED Notes (Signed)
Report called to Company secretary at [facility].

## 2020-10-14 NOTE — ED Provider Notes (Signed)
[facility] EMERGENCY DEPARTMENT Provider Note   CSN: [id number] Arrival date & time: 10/14/20  1719     History Chief Complaint  Patient presents with  . Blurred Vision    Candice BUCHTA is a 55 y.o. female   HPI Patient is a 55 year old female with a past medical history significant for hypertension, thyroid disease, HLD  Patient is presented today for symptoms of blurred vision, nausea, lightheadedness, dizziness, now with headache.   She states her symptoms began at approximately 9:30 AM this morning.  She states that she was looking at a computer and noticed that her vision was becoming more blurry.  Also occasionally feels lightheaded.  She states that her lightheadedness was exacerbated by standing up earlier today.   She states that her symptoms seem to have progressively worsened and she developed a headache at approximately 3:30 PM.  She denies any slurred speech but does state that she has a little bit of difficulty word finding.  Her husband is on the phone with her and states that she has not been confused or altered.   Patient states that she has never had a history of strokes before.  She has no additional medical issues.  She denies any fevers, chills, cough, congestion.   She states that she wears reading glasses at home but has never needed glasses to see from far distance.   No associated symptoms.  She states no recent URI-like symptoms.      Past Medical History:  Diagnosis Date  . Hypertension   . Thyroid disease     Patient Active Problem List   Diagnosis Date Noted  . TIA (transient ischemic attack) 10/14/2020    Past Surgical History:  Procedure Laterality Date  . ABDOMINAL HYSTERECTOMY       OB History   No obstetric history on file.     No family history on file.  Social History   Tobacco Use  . Smoking status: Never Smoker  . Smokeless tobacco: Never Used  Vaping Use  . Vaping Use: Never used  Substance Use Topics   . Alcohol use: No  . Drug use: No    Home Medications Prior to Admission medications   Medication Sig Start Date End Date Taking? Authorizing Provider  levothyroxine (SYNTHROID, LEVOTHROID) 125 MCG tablet Take 125 mcg by mouth daily before breakfast.   Yes [provider]  rosuvastatin (CRESTOR) 20 MG tablet Take 20 mg by mouth daily.   Yes [provider]  fluticasone (FLONASE) 50 MCG/ACT nasal spray Place 2 sprays into both nostrils daily. 05/06/20   Couture, Cortni S, PA-C  nitrofurantoin, macrocrystal-monohydrate, (MACROBID) 100 MG capsule Take 1 capsule (100 mg total) by mouth 2 (two) times daily. 12/28/12   Teressa Lower, NP  oxyCODONE-acetaminophen (PERCOCET/ROXICET) 5-325 MG per tablet Take 2 tablets by mouth every 4 (four) hours as needed for pain. 12/28/12   Teressa Lower, NP    Allergies    Penicillins and Codeine  Review of Systems   Review of Systems  Constitutional: Negative for chills and fever.  HENT: Negative for congestion.   Eyes: Positive for visual disturbance. Negative for pain.  Respiratory: Negative for cough and shortness of breath.   Cardiovascular: Negative for chest pain and leg swelling.  Gastrointestinal: Negative for abdominal pain and vomiting.  Genitourinary: Negative for dysuria.  Musculoskeletal: Negative for myalgias.  Skin: Negative for rash.  Neurological: Positive for dizziness, light-headedness and headaches. Negative for numbness.    Physical Exam  Updated Vital Signs BP 125/68 (BP Location: Right Arm)   Pulse 77   Temp 98 F (36.7 C) (Oral)   Resp 14   Ht 4\' 11"  (1.499 m)   Wt 77.1 kg   SpO2 95%   BMI 34.34 kg/m   Physical Exam Vitals and nursing note reviewed.  Constitutional:      General: She is not in acute distress. HENT:     Head: Normocephalic and atraumatic.     Nose: Nose normal.  Eyes:     General: No scleral icterus. Cardiovascular:     Rate and Rhythm: Normal rate and regular rhythm.      Pulses: Normal pulses.     Heart sounds: Normal heart sounds.  Pulmonary:     Effort: Pulmonary effort is normal. No respiratory distress.     Breath sounds: No wheezing.  Abdominal:     Palpations: Abdomen is soft.     Tenderness: There is no abdominal tenderness. There is no guarding or rebound.  Musculoskeletal:     Cervical back: Normal range of motion.     Right lower leg: No edema.     Left lower leg: No edema.  Skin:    General: Skin is warm and dry.     Capillary Refill: Capillary refill takes less than 2 seconds.  Neurological:     Mental Status: She is alert. Mental status is at baseline.     Comments: Alert and oriented to self, place, time and event.   Speech is fluent, clear without dysarthria or dysphasia.   Strength is diffusely weak /vs/ poor effort. Symmetric strength. Able to oppose gravity and slightly against resistance.  Sensation intact in upper/lower extremities   Gait deferred given dizziness.  Neg pronat drift Normal finger-to-nose and feet tapping.  CN I not tested  CN II grossly intact visual fields bilaterally. Did not visualize posterior eye.   CN III, IV, VI PERRLA ------- bidirectional nystagmus. Homonomous hemianopsia (left).   CN V Intact sensation to sharp and light touch to the face  CN VII facial movements symmetric  CN VIII not tested  CN IX, X no uvula deviation, symmetric rise of soft palate  CN XI 5/5 SCM and trapezius strength bilaterally  CN XII Midline tongue protrusion, symmetric L/R movements   Psychiatric:        Mood and Affect: Mood normal.        Behavior: Behavior normal.     ED Results / Procedures / Treatments   Labs (all labs ordered are listed, but only abnormal results are displayed) Labs Reviewed  BASIC METABOLIC PANEL - Abnormal; Notable for the following components:      Result Value   Potassium 3.1 (*)    All other components within normal limits  RESP PANEL BY RT-PCR (FLU A&B, COVID) ARPGX2  CBC WITH  DIFFERENTIAL/PLATELET  PROTIME-INR  APTT  RAPID URINE DRUG SCREEN, HOSP PERFORMED  URINALYSIS, ROUTINE W REFLEX MICROSCOPIC  PREGNANCY, URINE  ETHANOL    EKG None  Radiology CT Angio Head W or Wo Contrast  Result Date: 10/14/2020 CLINICAL DATA:  Acute neuro deficit.  Headache.  Difficulty walking. EXAM: CT ANGIOGRAPHY HEAD AND NECK TECHNIQUE: Multidetector CT imaging of the head and neck was performed using the standard protocol during bolus administration of intravenous contrast. Multiplanar CT image reconstructions and MIPs were obtained to evaluate the vascular anatomy. Carotid stenosis measurements (when applicable) are obtained utilizing NASCET criteria, using the distal internal carotid diameter as the denominator.  CONTRAST:  100mL OMNIPAQUE IOHEXOL 350 MG/ML SOLN COMPARISON:  CT head 01/12/2010 FINDINGS: CT HEAD FINDINGS Brain: No evidence of acute infarction, hemorrhage, hydrocephalus, extra-axial collection or mass lesion/mass effect. Vascular: Negative for hyperdense vessel Skull: Negative Sinuses: Paranasal sinuses clear. Orbits: Negative Review of the MIP images confirms the above findings CTA NECK FINDINGS Aortic arch: Normal aortic arch. Proximal great vessels widely patent. Right carotid system: Negative for stenosis or atherosclerotic disease. 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 No dissection or aneurysm. Left carotid system: Normal left carotid without stenosis. Vertebral arteries: Normal vertebral arteries bilaterally. Skeleton: Cervical spondylosis most notably C5-6. Negative for acute skeletal abnormality. Other neck: Negative for mass or adenopathy in the neck. Upper chest: Lung apices clear bilaterally. Review of the MIP images confirms the above findings CTA HEAD FINDINGS Anterior circulation: Cavernous carotid normal bilaterally. Anterior and middle cerebral arteries normal bilaterally. Posterior circulation: Both vertebral  arteries patent to the basilar. Posterior circulation normal without stenosis or large vessel occlusion. Venous sinuses: Normal venous enhancement. Anatomic variants: None Review of the MIP images confirms the above findings IMPRESSION: 1. Negative for intracranial large vessel occlusion 2. Negative CT head 3. No significant carotid or vertebral artery stenosis in the neck. Fibromuscular dysplasia right internal carotid artery. Electronically Signed   By: Marlan Palauharles  Clark M.D.   On: 10/14/2020 19:33   CT Angio Neck W and/or Wo Contrast  Result Date: 10/14/2020 CLINICAL DATA:  Acute neuro deficit.  Headache.  Difficulty walking. EXAM: CT ANGIOGRAPHY HEAD AND NECK TECHNIQUE: Multidetector CT imaging of the head and neck was performed using the standard protocol during bolus administration of intravenous contrast. Multiplanar CT image reconstructions and MIPs were obtained to evaluate the vascular anatomy. Carotid stenosis measurements (when applicable) are obtained utilizing NASCET criteria, using the distal internal carotid diameter as the denominator. CONTRAST:  100mL OMNIPAQUE IOHEXOL 350 MG/ML SOLN COMPARISON:  CT head 01/12/2010 FINDINGS: CT HEAD FINDINGS Brain: No evidence of acute infarction, hemorrhage, hydrocephalus, extra-axial collection or mass lesion/mass effect. Vascular: Negative for hyperdense vessel Skull: Negative Sinuses: Paranasal sinuses clear. Orbits: Negative Review of the MIP images confirms the above findings CTA NECK FINDINGS Aortic arch: Normal aortic arch. Proximal great vessels widely patent. Right carotid system: Negative for stenosis or atherosclerotic disease. 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 No dissection or aneurysm. Left carotid system: Normal left carotid without stenosis. Vertebral arteries: Normal vertebral arteries bilaterally. Skeleton: Cervical spondylosis most notably C5-6. Negative for acute skeletal abnormality. Other neck:  Negative for mass or adenopathy in the neck. Upper chest: Lung apices clear bilaterally. Review of the MIP images confirms the above findings CTA HEAD FINDINGS Anterior circulation: Cavernous carotid normal bilaterally. Anterior and middle cerebral arteries normal bilaterally. Posterior circulation: Both vertebral arteries patent to the basilar. Posterior circulation normal without stenosis or large vessel occlusion. Venous sinuses: Normal venous enhancement. Anatomic variants: None Review of the MIP images confirms the above findings IMPRESSION: 1. Negative for intracranial large vessel occlusion 2. Negative CT head 3. No significant carotid or vertebral artery stenosis in the neck. Fibromuscular dysplasia right internal carotid artery. Electronically Signed   By: Marlan Palauharles  Clark M.D.   On: 10/14/2020 19:33    Procedures Procedures   Medications Ordered in ED Medications  ondansetron (ZOFRAN) injection 4 mg (4 mg Intravenous Given 10/14/20 1833)  iohexol (OMNIPAQUE) 350 MG/ML injection 100 mL (100 mLs Intravenous Contrast Given 10/14/20 1858)  aspirin chewable tablet 324 mg (  324 mg Oral Given 10/14/20 1939)    ED Course  I have reviewed the triage vital signs and the nursing notes.  Pertinent labs & imaging results that were available during my care of the patient were reviewed by me and considered in my medical decision making (see chart for details).  Patient is a 55 year old female with past medical history significant for HLD, HTN and smoking   Patient has on physical exam patient has left-sided homonymous hemianopsia and bidirectional nystagmus.  No focal weakness.  I discussed this case with my attending physician who cosigned this note including patient's presenting symptoms, physical exam, and planned diagnostics and interventions. Attending physician stated agreement with plan or made changes to plan which were implemented.   Attending physician assessed patient at bedside.  Given the  chronicity of this patient's symptoms and the fact that she has no limb weakness and is the AN negative low suspicion for LVO.  Consult placed to neurology.  CTA pending.  Urinalysis without evidence of infection.  Urine toxicity screen negative.  Urine pregnant test negative.  Covid test pending at this time.  Coags within normal limits CBC without leukocytosis or anemia.  Mild hypokalemia no other abnormalities on BMP.  Clinical Course as of 10/14/20 2014  Mon Oct 14, 2020  1950 IMPRESSION: 1. Negative for intracranial large vessel occlusion 2. Negative CT head 3. No significant carotid or vertebral artery stenosis in the neck. Fibromuscular dysplasia right internal carotid artery.    [WF]  1950 Discussed with neurology S. Bhagat who recommends admission to hospitalist.  Neurology will consult. [WF]    Clinical Course User Index [WF] Gailen Shelter, PA   Discussed with Dr. Toniann Fail of hospitalist service who will admit patient for stroke work-up.  MDM Rules/Calculators/A&P                          MARKEESHA CHAR was evaluated in Emergency Department on 10/14/2020 for the symptoms described in the history of present illness. She was evaluated in the context of the global COVID-19 pandemic, which necessitated consideration that the patient might be at risk for infection with the SARS-CoV-2 virus that causes COVID-19. Institutional protocols and algorithms that pertain to the evaluation of patients at risk for COVID-19 are in a state of rapid change based on information released by regulatory bodies including the CDC and federal and state organizations. These policies and algorithms were followed during the patient's care in the ED.  Final Clinical Impression(s) / ED Diagnoses Final diagnoses:  Blurry vision  Nystagmus    Rx / DC Orders ED Discharge Orders    None       Gailen Shelter, [place] 10/14/20 2014    Milagros Loll, MD 10/15/20 [phone]

## 2020-10-14 NOTE — ED Notes (Signed)
EKG given to pa [facility]

## 2020-10-14 NOTE — ED Triage Notes (Signed)
Pt arrived from home related to blurry vision and nausea. Pt started that symptoms started this morning at approx. 0930 to 100 this morning. Pt is also experiencing nausea but no emesis. Pt denies pain or LOC. Pt stated that she has the onset of headache approximately 1530.

## 2020-10-15 ENCOUNTER — Ambulatory Visit (HOSPITAL_COMMUNITY): Payer: Managed Care, Other (non HMO)

## 2020-10-15 ENCOUNTER — Observation Stay (HOSPITAL_COMMUNITY): Payer: Managed Care, Other (non HMO)

## 2020-10-15 ENCOUNTER — Observation Stay (HOSPITAL_BASED_OUTPATIENT_CLINIC_OR_DEPARTMENT_OTHER): Payer: Managed Care, Other (non HMO)

## 2020-10-15 DIAGNOSIS — E876 Hypokalemia: Secondary | ICD-10-CM | POA: Diagnosis present

## 2020-10-15 DIAGNOSIS — R519 Headache, unspecified: Secondary | ICD-10-CM | POA: Diagnosis not present

## 2020-10-15 DIAGNOSIS — G459 Transient cerebral ischemic attack, unspecified: Secondary | ICD-10-CM

## 2020-10-15 DIAGNOSIS — Z20822 Contact with and (suspected) exposure to covid-19: Secondary | ICD-10-CM | POA: Diagnosis not present

## 2020-10-15 DIAGNOSIS — Z79899 Other long term (current) drug therapy: Secondary | ICD-10-CM | POA: Diagnosis not present

## 2020-10-15 DIAGNOSIS — E7800 Pure hypercholesterolemia, unspecified: Secondary | ICD-10-CM | POA: Diagnosis not present

## 2020-10-15 DIAGNOSIS — E039 Hypothyroidism, unspecified: Secondary | ICD-10-CM | POA: Diagnosis present

## 2020-10-15 DIAGNOSIS — H538 Other visual disturbances: Secondary | ICD-10-CM

## 2020-10-15 DIAGNOSIS — E785 Hyperlipidemia, unspecified: Secondary | ICD-10-CM | POA: Diagnosis not present

## 2020-10-15 LAB — BASIC METABOLIC PANEL
Anion gap: 7 (ref 5–15)
BUN: 8 mg/dL (ref 6–20)
CO2: 24 mmol/L (ref 22–32)
Calcium: 9.1 mg/dL (ref 8.9–10.3)
Chloride: 108 mmol/L (ref 98–111)
Creatinine, Ser: 0.79 mg/dL (ref 0.44–1.00)
GFR, Estimated: >60 mL/min (ref >60)
Glucose, Bld: 117 mg/dL — ABNORMAL HIGH (ref 70–99)
Potassium: 3.1 mmol/L — ABNORMAL LOW (ref 3.5–5.1)
Sodium: 139 mmol/L (ref 135–145)

## 2020-10-15 LAB — ECHOCARDIOGRAM COMPLETE
Area-P 1/2: 3.91 cm2
Calc EF: 69.3 %
Height: 59 in
S' Lateral: 2.2 cm
Single Plane A2C EF: 71.1 %
Single Plane A4C EF: 69 %
Weight: 2720 oz

## 2020-10-15 LAB — LIPID PANEL
Cholesterol: 179 mg/dL (ref 0–200)
HDL: 26 mg/dL — ABNORMAL LOW (ref >40)
LDL Cholesterol: 95 mg/dL (ref 0–99)
Total CHOL/HDL Ratio: 6.9 RATIO
Triglycerides: 290 mg/dL — ABNORMAL HIGH (ref <150)
VLDL: 58 mg/dL — ABNORMAL HIGH (ref 0–40)

## 2020-10-15 LAB — CBC
HCT: 36.4 % (ref 36.0–46.0)
Hemoglobin: 12.6 g/dL (ref 12.0–15.0)
MCH: 31.6 pg (ref 26.0–34.0)
MCHC: 34.6 g/dL (ref 30.0–36.0)
MCV: 91.2 fL (ref 80.0–100.0)
Platelets: 293 10*3/uL (ref 150–400)
RBC: 3.99 MIL/uL (ref 3.87–5.11)
RDW: 14.1 % (ref 11.5–15.5)
WBC: 7.8 10*3/uL (ref 4.0–10.5)
nRBC: 0 % (ref 0.0–0.2)

## 2020-10-15 LAB — HEMOGLOBIN A1C
Hgb A1c MFr Bld: 5.7 % — ABNORMAL HIGH (ref 4.8–5.6)
Mean Plasma Glucose: 116.89 mg/dL

## 2020-10-15 LAB — HIV ANTIBODY (ROUTINE TESTING W REFLEX): HIV Screen 4th Generation wRfx: NONREACTIVE

## 2020-10-15 MED ORDER — ASPIRIN EC 81 MG PO TBEC
81.0000 mg | DELAYED_RELEASE_TABLET | Freq: Every day | ORAL | Status: DC
Start: 2020-10-16 — End: 2020-10-16
  Administered 2020-10-16: 81 mg via ORAL
  Filled 2020-10-15: qty 1

## 2020-10-15 MED ORDER — ASPIRIN 325 MG PO TABS
325.0000 mg | ORAL_TABLET | Freq: Every day | ORAL | Status: DC
  Administered 2020-10-15: 325 mg via ORAL
  Filled 2020-10-15: qty 1

## 2020-10-15 MED ORDER — STROKE: EARLY STAGES OF RECOVERY BOOK: Freq: Once | Status: AC

## 2020-10-15 MED ORDER — ROSUVASTATIN CALCIUM 20 MG PO TABS
20.0000 mg | ORAL_TABLET | Freq: Every day | ORAL | Status: DC
  Administered 2020-10-15 – 2020-10-16 (×2): 20 mg via ORAL
  Filled 2020-10-15 (×2): qty 1

## 2020-10-15 MED ORDER — SODIUM CHLORIDE 0.9 % IV SOLN: INTRAVENOUS | Status: AC

## 2020-10-15 MED ORDER — ACETAMINOPHEN 650 MG RE SUPP: 650.0000 mg | Freq: Four times a day (QID) | RECTAL | Status: DC | PRN

## 2020-10-15 MED ORDER — LEVOTHYROXINE SODIUM 25 MCG PO TABS
125.0000 ug | ORAL_TABLET | Freq: Every day | ORAL | Status: DC
  Filled 2020-10-15: qty 1

## 2020-10-15 MED ORDER — ASPIRIN 300 MG RE SUPP: 300.0000 mg | Freq: Every day | RECTAL | Status: DC

## 2020-10-15 MED ORDER — CLOPIDOGREL BISULFATE 75 MG PO TABS
75.0000 mg | ORAL_TABLET | Freq: Every day | ORAL | Status: DC
  Administered 2020-10-15 – 2020-10-16 (×2): 75 mg via ORAL
  Filled 2020-10-15 (×2): qty 1

## 2020-10-15 MED ORDER — STROKE: EARLY STAGES OF RECOVERY BOOK
Freq: Once | Status: DC
  Filled 2020-10-15: qty 1

## 2020-10-15 MED ORDER — ACETAMINOPHEN 325 MG PO TABS
650.0000 mg | ORAL_TABLET | Freq: Four times a day (QID) | ORAL | Status: DC | PRN
  Administered 2020-10-15: 650 mg via ORAL
  Filled 2020-10-15: qty 2

## 2020-10-15 MED ORDER — POTASSIUM CHLORIDE CRYS ER 20 MEQ PO TBCR
40.0000 meq | EXTENDED_RELEASE_TABLET | Freq: Once | ORAL | Status: AC
  Administered 2020-10-15: 40 meq via ORAL
  Filled 2020-10-15: qty 2

## 2020-10-15 MED ORDER — PROCHLORPERAZINE EDISYLATE 10 MG/2ML IJ SOLN: 5.0000 mg | INTRAMUSCULAR | Status: DC | PRN

## 2020-10-15 MED ORDER — SENNOSIDES-DOCUSATE SODIUM 8.6-50 MG PO TABS: 1.0000 | ORAL_TABLET | Freq: Every evening | ORAL | Status: DC | PRN

## 2020-10-15 MED ORDER — ENOXAPARIN SODIUM 40 MG/0.4ML ~~LOC~~ SOLN
40.0000 mg | SUBCUTANEOUS | Status: DC
  Administered 2020-10-15 – 2020-10-16 (×2): 40 mg via SUBCUTANEOUS
  Filled 2020-10-15 (×2): qty 0.4

## 2020-10-15 MED ORDER — STROKE: EARLY STAGES OF RECOVERY BOOK
Freq: Once | Status: AC
  Filled 2020-10-15: qty 1

## 2020-10-15 MED ORDER — LEVOTHYROXINE SODIUM 75 MCG PO TABS
75.0000 ug | ORAL_TABLET | Freq: Every day | ORAL | Status: DC
  Administered 2020-10-15 – 2020-10-16 (×2): 75 ug via ORAL
  Filled 2020-10-15 (×2): qty 1

## 2020-10-15 MED ORDER — SODIUM CHLORIDE 0.9 % IV SOLN: INTRAVENOUS | Status: DC

## 2020-10-15 NOTE — Plan of Care (Signed)
  Problem: Education: Goal: Knowledge of patient specific risk factors addressed and post discharge goals established will improve Outcome: Progressing Goal: Individualized Educational Video(s) Outcome: Progressing   Problem: Nutrition: Goal: Dietary intake will improve Outcome: Progressing   Problem: Ischemic Stroke/TIA Tissue Perfusion: Goal: Complications of ischemic stroke/TIA will be minimized Outcome: Progressing   Problem: Spontaneous Subarachnoid Hemorrhage Tissue Perfusion: Goal: Complications of Spontaneous Subarachnoid Hemorrhage will be minimized Outcome: Progressing   Problem: Education: Goal: Knowledge of General Education information will improve Description: Including pain rating scale, medication(s)/side effects and non-pharmacologic comfort measures Outcome: Progressing   Problem: Health Behavior/Discharge Planning: Goal: Ability to manage health-related needs will improve Outcome: Progressing   Problem: Clinical Measurements: Goal: Ability to maintain clinical measurements within normal limits will improve Outcome: Progressing Goal: Will remain free from infection Outcome: Progressing Goal: Diagnostic test results will improve Outcome: Progressing Goal: Respiratory complications will improve Outcome: Progressing Goal: Cardiovascular complication will be avoided Outcome: Progressing   Problem: Activity: Goal: Risk for activity intolerance will decrease Outcome: Progressing   Problem: Nutrition: Goal: Adequate nutrition will be maintained Outcome: Progressing   Problem: Coping: Goal: Level of anxiety will decrease Outcome: Progressing   Problem: Elimination: Goal: Will not experience complications related to bowel motility Outcome: Progressing Goal: Will not experience complications related to urinary retention Outcome: Progressing   Problem: Pain Managment: Goal: General experience of comfort will improve Outcome: Progressing   Problem:  Safety: Goal: Ability to remain free from injury will improve Outcome: Progressing   Problem: Skin Integrity: Goal: Risk for impaired skin integrity will decrease Outcome: Progressing

## 2020-10-15 NOTE — Progress Notes (Addendum)
OT Cancellation Note  Patient Details Name: [name] MRN: [id number] DOB: 07/04/1966   Cancelled Treatment:    Reason Eval/Treat Not Completed: At time of first attempt patient off floor for testing. At time of 2nd attempt patient with another discipline. OT to check back as time allows.   Kallie Edward OTR/L Supplemental OT, Department of rehab services [phone]  Canisha Issac R H. 10/15/2020, 10:40 AM

## 2020-10-15 NOTE — Evaluation (Signed)
Physical Therapy Evaluation Patient Details Name: [name] MRN: [id number] DOB: [birth year] Today's Date: 10/15/2020   History of Present Illness  55 y.o. female presenting with worsening headache, dizziness, blurry vision and nausea without emesis. Patient admitted with suspicion for TIA. CT angio head/neck (-) for acute intracranial abnormality. No significant carotid or vertebral artery stenosis noted. Further work-up pending. PMHx significant for HTN, HLD, and thyroid disease.  Clinical Impression  Patient presents with blurry vision, impaired coordination LUE, dizziness, impaired balance and impaired mobility s/p above. Pt reports being independent for ADLs/IADLs and driving PTA. Today, pt reports some mild left sided weakness which is not appreciated during MMT, left incoordination and impaired vision impacting mobility. Tolerated transfers and gait training with Min guard assist for balance/safety. Noted to have a guarded gait- tolerating head movements with mild dizziness with horizontal head turns. Will follow acutely to maximize independence and mobility prior to return home as pt not functioning at baseline.    Follow Up Recommendations No PT follow up;Supervision - Intermittent    Equipment Recommendations  None recommended by PT    Recommendations for Other Services       Precautions / Restrictions Precautions Precautions: None Restrictions Weight Bearing Restrictions: No      Mobility  Bed Mobility Overal bed mobility: Modified Independent             General bed mobility comments: Increased time, no assist needed.    Transfers Overall transfer level: Needs assistance Equipment used: None Transfers: Sit to/from Stand Sit to Stand: Min guard         General transfer comment: Min guard for safety. Stood from [employer], stood from toilet x1. Slow to rise with minimal head movements  Ambulation/Gait Ambulation/Gait assistance: Min guard Gait Distance  (Feet): 100 Feet Assistive device: None Gait Pattern/deviations: Step-through pattern;Decreased stride length Gait velocity: decreased Gait velocity interpretation: <1.8 ft/sec, indicate of risk for recurrent falls General Gait Details: Slow, guarded gait with decreased arm swing LUE. When performing head movements horizontally, pt reports some dizziness but not vertically.  Stairs            Wheelchair Mobility    Modified Rankin (Stroke Patients Only) Modified Rankin (Stroke Patients Only) Pre-Morbid Rankin Score: No symptoms Modified Rankin: Slight disability     Balance Overall balance assessment: Needs assistance Sitting-balance support: Feet supported;No upper extremity supported Sitting balance-Leahy Scale: Good Sitting balance - Comments: Able to reach down and adjust socks without difficulty.   Standing balance support: During functional activity Standing balance-Leahy Scale: Fair Standing balance comment: Close min guard for safety due to blurry vision.                             Pertinent Vitals/Pain Pain Assessment: No/denies pain    Home Living Family/patient expects to be discharged to:: Private residence Living Arrangements: Spouse/significant other Available Help at Discharge: Family;Available PRN/intermittently Type of Home: Other(Comment) ([place]) Home Access: Stairs to enter Entrance Stairs-Rails: Right Entrance Stairs-Number of Steps: 1 flight Home Layout: One level Home Equipment: None      Prior Function Level of Independence: Independent         Comments: Works as an Airline pilot for [employer]. Drives. independent for ADls/IADLs.     Hand Dominance   Dominant Hand: Left    Extremity/Trunk Assessment   Upper Extremity Assessment Upper Extremity Assessment: Defer to OT evaluation LUE Deficits / Details: Impaired finger to nose  test and opposition LUE Sensation: WNL LUE Coordination: decreased fine motor;decreased  gross motor    Lower Extremity Assessment Lower Extremity Assessment: Overall WFL for tasks assessed    Cervical / Trunk Assessment Cervical / Trunk Assessment: Normal  Communication   Communication: No difficulties  Cognition Arousal/Alertness: Awake/alert Behavior During Therapy: WFL for tasks assessed/performed Overall Cognitive Status: Within Functional Limits for tasks assessed                                        General Comments General comments (skin integrity, edema, etc.): Significant other present during session. Neurologist came in during session. Pt reports blurry vision and also difficulty seeing out of left visual field in periphery as well as nasal portion of left eye. needs to move head to find finger. Able to reach clock on wall and board but blurry. Reports occasional tinnitus in ear left>right which is not new with some hearing loss in left ear at baseline.    Exercises     Assessment/Plan    PT Assessment Patient needs continued PT services  PT Problem List Decreased mobility;Decreased balance;Decreased coordination       PT Treatment Interventions Therapeutic exercise;Balance training;Gait training;Stair training;Functional mobility training;Therapeutic activities;Patient/family education;Neuromuscular re-education    PT Goals (Current goals can be found in the Care Plan section)  Acute Rehab PT Goals Patient Stated Goal: to get better PT Goal Formulation: With patient Time For Goal Achievement: 10/29/20 Potential to Achieve Goals: Good    Frequency Min 3X/week   Barriers to discharge        Co-evaluation               AM-PAC PT "6 Clicks" Mobility  Outcome Measure Help needed turning from your back to your side while in a flat bed without using bedrails?: None Help needed moving from lying on your back to sitting on the side of a flat bed without using bedrails?: None Help needed moving to and from a bed to a chair  (including a wheelchair)?: A Little Help needed standing up from a chair using your arms (e.g., wheelchair or bedside chair)?: A Little Help needed to walk in hospital room?: A Little Help needed climbing 3-5 steps with a railing? : A Little 6 Click Score: 20    End of Session Equipment Utilized During Treatment: Gait belt Activity Tolerance: Patient tolerated treatment well Patient left: in bed;with call bell/phone within reach;with bed alarm set;with family/visitor present Nurse Communication: Mobility status PT Visit Diagnosis: Difficulty in walking, not elsewhere classified (R26.2);Unsteadiness on feet (R26.81)    Time: 1125-1207 PT Time Calculation (min) (ACUTE ONLY): 42 min   Charges:   PT Evaluation $PT Eval Moderate Complexity: 1 Mod PT Treatments $Gait Training: 8-22 mins        Vale Haven, PT, DPT Acute Rehabilitation Services Pager [phone] Office [phone]      Blake Divine A Lanier Ensign 10/15/2020, 1:14 PM

## 2020-10-15 NOTE — Progress Notes (Signed)
  Echocardiogram 2D Echocardiogram has been performed.  [name] 10/15/2020, 2:12 PM

## 2020-10-15 NOTE — H&P (Signed)
History and Physical        Hospital Admission Note Date: 10/15/2020  Patient name: [name] Medical record number: [id number] Date of birth: 1966/06/09 Age: 55 y.o. Gender: female  PCP: Loura Pardon, PA    Patient coming from: Home -> [facility]  I have reviewed all records in the [facility].    Chief Complaint:  Sudden blurry vision, nausea started between 9:30 AM to 10 AM on 2/21  HPI: Patient is a 55 year old female with history of hypertension, hypothyroidism, hyperlipidemia presented to med [facility] with sudden blurry vision, nausea and headache that started between 9:30 AM to 10 AM on 2/21.  Patient reported that she was working remotely from home on her computer when she suddenly noticed that her vision was becoming more blurry.  She uses reading glasses, cleaned them however no improvement.  Then she started having dizziness, exacerbated on standing up.  Patient also reported episodes of vertigo as well.  Patient reported that her symptoms progressively continued to worsen and she developed a headache around 3:30 PM.  No vomiting however had nausea, no slurred speech, confusion or syncope. Patient reports no prior history of TIA/strokes before.  Also reported off-and-on tinnitus and chronic hearing loss which is not new.  Patient presented to med [facility], then subsequently transferred to [facility] for stroke work-up. At the time of my examination, dizziness and blurred vision has somewhat improved, still having mild headache.  ED work-up/course:  Temp 97.6, respiratory rate 18, heart rate 65, BP 112/68 CT angio head and neck showed no LVO, negative CT head, no significant carotid or vertebral artery stenosis in the neck.  Fibromuscular dysplasia right ICA. BMET showed potassium 3.1 otherwise unremarkable.  CBC  unremarkable.  LDL 95  Review of Systems: Positives marked in 'bold' Constitutional: Denies fever, chills, diaphoresis, poor appetite and fatigue.  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   Cardiovascular: Denies chest pain, palpitations and leg swelling.  Gastrointestinal: Denies  vomiting, abdominal pain, diarrhea, constipation, blood in stool and abdominal distention. + Nausea Genitourinary: Denies dysuria, urgency, frequency, hematuria, flank pain and difficulty urinating.  Musculoskeletal: Denies myalgias, back pain, joint swelling, arthralgias and gait problem.  Skin: Denies pallor, rash and wound.  Neurological: Please see HPI Hematological: Denies adenopathy. Easy bruising, personal or family bleeding history  Psychiatric/Behavioral: Denies suicidal ideation, mood changes, confusion, nervousness, sleep disturbance and agitation  Past Medical History: Past Medical History:  Diagnosis Date   Hypertension    Thyroid disease     Past Surgical History:  Procedure Laterality Date   ABDOMINAL HYSTERECTOMY      Medications: Prior to Admission medications   Medication Sig Start Date End Date Taking? Authorizing Provider  levothyroxine (SYNTHROID, LEVOTHROID) 125 MCG tablet Take 125 mcg by mouth daily before breakfast.   Yes [provider]  rosuvastatin (CRESTOR) 20 MG tablet Take 20 mg by mouth daily.   Yes [provider]  fluticasone (FLONASE) 50 MCG/ACT nasal spray Place 2 sprays into both nostrils daily. 05/06/20   Couture, Cortni S, PA-C  nitrofurantoin, macrocrystal-monohydrate, (MACROBID) 100 MG capsule Take 1 capsule (100 mg total) by mouth 2 (two) times daily. 12/28/12   Teressa Lower, NP  oxyCODONE-acetaminophen  (PERCOCET/ROXICET) 5-325 MG per tablet Take 2 tablets by mouth every 4 (four) hours as needed for pain. 12/28/12   Teressa Lower, NP    Allergies:   Allergies  Allergen Reactions   Penicillins Anaphylaxis   Codeine     Social History:  reports that she has never smoked. She has never used smokeless tobacco. She reports that she does not drink alcohol and does not use drugs.  Family History: Patient reported that her grandfather had a stroke due to hypertension otherwise no significant family history reported by patient.  Physical Exam: Blood pressure 112/68, pulse 65, temperature 97.6 F (36.4 C), temperature source Oral, resp. rate 18, height 4\' 11"  (1.499 m), weight 77.1 kg, SpO2 95 %. General: Alert, awake, oriented x3, in no acute distress. Eyes: pink conjunctiva,anicteric sclera, pupils equal and reactive to light and accomodation, HEENT: normocephalic, atraumatic, oropharynx clear Neck: supple, no masses or lymphadenopathy, no goiter, no bruits, no JVD CVS: Regular rate and rhythm, without murmurs, rubs or gallops. No lower extremity edema Resp : Clear to auscultation bilaterally, no wheezing, rales or rhonchi. GI : Soft, nontender, nondistended, positive bowel sounds, no masses. No hepatomegaly. No hernia.  Musculoskeletal: No clubbing or cyanosis, positive pedal pulses. No contracture. ROM intact  Neuro: Grossly intact, no focal neurological deficits, strength 5/5 upper and lower extremities bilaterally Psych: alert and oriented x 3, normal mood and affect Skin: no rashes or lesions, warm and dry   LABS on Admission: I have personally reviewed all the labs and imagings below    Basic Metabolic Panel: Recent Labs  Lab 10/14/20 1807 10/15/20 0813  NA 140 139  K 3.1* 3.1*  CL 106 108  CO2 25 24  GLUCOSE 93 117*  BUN 11 8  CREATININE 0.74 0.79  CALCIUM 9.3 9.1   Liver Function Tests: No results for input(s): AST, ALT, ALKPHOS, BILITOT, PROT, ALBUMIN in the  last 168 hours. No results for input(s): LIPASE, AMYLASE in the last 168 hours. No results for input(s): AMMONIA in the last 168 hours. CBC: Recent Labs  Lab 10/14/20 1807 10/15/20 0813  WBC 9.6 7.8  NEUTROABS 4.9  --   HGB 14.1 12.6  HCT 42.8 36.4  MCV 93.2 91.2  PLT 331 293   Cardiac Enzymes: No results for input(s): CKTOTAL, CKMB, CKMBINDEX, TROPONINI in the last 168 hours. BNP: Invalid input(s): POCBNP CBG: No results for input(s): GLUCAP in the last 168 hours.  Radiological Exams on Admission:  CT Angio Head W or Wo Contrast  Result Date: 10/14/2020 CLINICAL DATA:  Acute neuro deficit.  Headache.  Difficulty walking. EXAM: CT ANGIOGRAPHY HEAD AND NECK TECHNIQUE: Multidetector CT imaging of the head and neck was performed using the standard protocol during bolus administration of intravenous contrast. Multiplanar CT image reconstructions and MIPs were obtained to evaluate the vascular anatomy. Carotid stenosis measurements (when applicable) are obtained utilizing NASCET criteria, using the distal internal carotid diameter as the denominator. CONTRAST:  10/16/2020 OMNIPAQUE IOHEXOL 350 MG/ML SOLN COMPARISON:  CT head 01/12/2010 FINDINGS: CT HEAD FINDINGS Brain: No evidence of acute infarction, hemorrhage, hydrocephalus, extra-axial collection or mass lesion/mass effect. Vascular: Negative for hyperdense vessel Skull: Negative Sinuses: Paranasal sinuses clear. Orbits: Negative Review of the  MIP images confirms the above findings CTA NECK FINDINGS Aortic arch: Normal aortic arch. Proximal great vessels widely patent. Right carotid system: Negative for stenosis or atherosclerotic disease. 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 No dissection or aneurysm. Left carotid system: Normal left carotid without stenosis. Vertebral arteries: Normal vertebral arteries bilaterally. Skeleton: Cervical spondylosis most notably C5-6. Negative for acute skeletal  abnormality. Other neck: Negative for mass or adenopathy in the neck. Upper chest: Lung apices clear bilaterally. Review of the MIP images confirms the above findings CTA HEAD FINDINGS Anterior circulation: Cavernous carotid normal bilaterally. Anterior and middle cerebral arteries normal bilaterally. Posterior circulation: Both vertebral arteries patent to the basilar. Posterior circulation normal without stenosis or large vessel occlusion. Venous sinuses: Normal venous enhancement. Anatomic variants: None Review of the MIP images confirms the above findings IMPRESSION: 1. Negative for intracranial large vessel occlusion 2. Negative CT head 3. No significant carotid or vertebral artery stenosis in the neck. Fibromuscular dysplasia right internal carotid artery. Electronically Signed   By: Marlan Palauharles  Clark M.D.   On: 10/14/2020 19:33   CT Angio Neck W and/or Wo Contrast  Result Date: 10/14/2020 CLINICAL DATA:  Acute neuro deficit.  Headache.  Difficulty walking. EXAM: CT ANGIOGRAPHY HEAD AND NECK TECHNIQUE: Multidetector CT imaging of the head and neck was performed using the standard protocol during bolus administration of intravenous contrast. Multiplanar CT image reconstructions and MIPs were obtained to evaluate the vascular anatomy. Carotid stenosis measurements (when applicable) are obtained utilizing NASCET criteria, using the distal internal carotid diameter as the denominator. CONTRAST:  100mL OMNIPAQUE IOHEXOL 350 MG/ML SOLN COMPARISON:  CT head 01/12/2010 FINDINGS: CT HEAD FINDINGS Brain: No evidence of acute infarction, hemorrhage, hydrocephalus, extra-axial collection or mass lesion/mass effect. Vascular: Negative for hyperdense vessel Skull: Negative Sinuses: Paranasal sinuses clear. Orbits: Negative Review of the MIP images confirms the above findings CTA NECK FINDINGS Aortic arch: Normal aortic arch. Proximal great vessels widely patent. Right carotid system: Negative for stenosis or  atherosclerotic disease. 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 No dissection or aneurysm. Left carotid system: Normal left carotid without stenosis. Vertebral arteries: Normal vertebral arteries bilaterally. Skeleton: Cervical spondylosis most notably C5-6. Negative for acute skeletal abnormality. Other neck: Negative for mass or adenopathy in the neck. Upper chest: Lung apices clear bilaterally. Review of the MIP images confirms the above findings CTA HEAD FINDINGS Anterior circulation: Cavernous carotid normal bilaterally. Anterior and middle cerebral arteries normal bilaterally. Posterior circulation: Both vertebral arteries patent to the basilar. Posterior circulation normal without stenosis or large vessel occlusion. Venous sinuses: Normal venous enhancement. Anatomic variants: None Review of the MIP images confirms the above findings IMPRESSION: 1. Negative for intracranial large vessel occlusion 2. Negative CT head 3. No significant carotid or vertebral artery stenosis in the neck. Fibromuscular dysplasia right internal carotid artery. Electronically Signed   By: Marlan Palauharles  Clark M.D.   On: 10/14/2020 19:33      EKG: Independently reviewed. **Rate 76, normal sinus rhythm   Assessment/Plan Principal Problem:   TIA (transient ischemic attack) -Presenting with sudden blurred vision, nausea, dizziness, headache, no prior history of stroke, not on any antiplatelet agents PTA.  Symptoms improving. -CT angiogram head and neck negative for intracranial LVO, no significant carotid or vertebral artery stenosis in the neck.  Fibromuscular dysplasia right ICA. -Neurology consulted, recommended stroke work-up.  Follow MRI of the brain, 2D echo -LDL 95, continue statin, aspirin p.o./PR -Hemoglobin A1c 5.7 -  Follow PT OT evaluation - will check orthostatic vitals, still somewhat dizzy   Active Problems:   Hypothyroidism -Follow TSH, continue Synthroid     Hyperlipidemia -LDL 95, continue statin    Hypokalemia -Potassium 3.1, replaced p.o.   DVT prophylaxis: Lovenox  CODE STATUS: Full CODE STATUS  Consults called: Neurology, discussed with Dr. Wilford Corner  Family Communication: Admission, patients condition and plan of care including tests being ordered have been discussed with the patient who indicates understanding and agree with the plan and Code Status  Admission status:   The medical decision making on this patient was of high complexity and the patient is at high risk for clinical deterioration, therefore this is a level 3 admission.  Severity of Illness:      The appropriate patient status for this patient is OBSERVATION. Observation status is judged to be reasonable and necessary in order to provide the required intensity of service to ensure the patient's safety. The patient's presenting symptoms, physical exam findings, and initial radiographic and laboratory data in the context of their medical condition is felt to place them at decreased risk for further clinical deterioration. Furthermore, it is anticipated that the patient will be medically stable for discharge from the hospital within 2 midnights of admission. The following factors support the patient status of observation.   " The patient's presenting symptoms include sudden dizziness, lightheadedness, headache. " The physical exam findings include still dizzy, possibly orthostatic " The initial radiographic and laboratory data are CTA negative for any intracranial LVO, vertebral artery stenosis, hypokalemia     Time Spent on Admission: 60 minutes     Bryon Parker M.D. [facility] 10/15/2020, 9:13 AM

## 2020-10-15 NOTE — Consult Note (Addendum)
NEURO HOSPITALIST CONSULT NOTE   Requestig physician: Dr. Thad Ranger   Reason for Consult:Left Homonomous Hemianopsia/TIA   History obtained from:  Patient and chart review.  HPI:                                                                                                                                          [name] is an 55 y.o. female with PMHx of hypertension, hypothyroidism, hyperlipidemia, rocky mountain spotted fever, COVID 19 infection presented yesterday to med [facility] ED with blurred vision, nausea, lightheadedness, dizziness and headache.  She stated her symptoms began approx. 9:30 AM on 10/14/2020.  She was looking at her computer and noticed her vision started becoming blurry and also endorsed lightheadedness which was exacerbated by standing up.  Symptoms worsened progressively and she developed a headache around 3:30 PM yesterday but denied any slurred speech although noted some word finding difficulty.  Husband on phone stated that she was not confused or had altered mental status.  She wears reading glasses at home. No fever, chills, cough, congestion, urinary symptoms.  On physical examination patient was found to have left homonymous hemianopsia and neurology is consulted for further evaluation. Patient notes h/o tinnitus on/off (years), chronic left hearing loss.  Past Surgical History:  Procedure Laterality Date  . ABDOMINAL HYSTERECTOMY      Family History: Paternal grandfather had stroke  Social History:  reports that she has never smoked. She has never used smokeless tobacco. She reports that she does not drink alcohol and does not use drugs.  Allergies  Allergen Reactions  . Penicillins Anaphylaxis  . Codeine     MEDICATIONS:                                                                                                                     Current Outpatient Medications  Medication Instructions  . fluticasone  (FLONASE) 50 MCG/ACT nasal spray 2 sprays, Each Nare, Daily  . levothyroxine (SYNTHROID) 75 mcg, Oral, Daily before breakfast  . nitrofurantoin (macrocrystal-monohydrate) (MACROBID) 100 mg, Oral, 2 times daily  . oxyCODONE-acetaminophen (PERCOCET/ROXICET) 5-325 MG per tablet 2 tablets, Oral, Every 4 hours PRN  . rosuvastatin (CRESTOR) 20 mg, Daily   ROS:  History obtained from chart review and the patient ROS: negative except as noted in HPI   Blood pressure 130/70, pulse 60, temperature 98 F (36.7 C), temperature source Oral, resp. rate 18, height 4\' 11"  (1.499 m), weight 77.1 kg, SpO2 97 %.   General Examination:                                                                                                       Physical Exam  HEENT-  Normocephalic, no lesions, without obvious abnormality.  Normal external eye and conjunctiva.   Cardiovascular- Regular rate and rythym.  Lungs-no respiratory distress. Abdomen- soft, non-distended, no tenderness. Extremities- Warm, dry and intact Musculoskeletal-right knee tenderness. Skin-warm and dry.  Neurological Examination Mental Status: Alert, oriented, thought content appropriate.  Speech fluent except can't spell WORLD backwards or do serial substraction.   Able to follow 3 step commands without difficulty. Cranial Nerves: II, III,IV, VI: Decreased visual acuity bilaterally, left homonomous hemianopsia, bidirectional horizontal nystagmus (<5 beats, so most likely physiological). Ptosis not present, extra-ocular motions intact bilaterally pupils equal, round, reactive to light and accommodation V,VII: smile symmetric, facial light touch sensation normal bilaterally VIII: hearing normal bilaterally IX,X: uvula rises symmetrically XI: bilateral shoulder shrug XII: midline tongue extension Motor: Right  : Upper extremity   5/5    Left:     Upper extremity   5/5  Lower extremity   5/5     Lower extremity   5/5 Tone and bulk:normal tone throughout; no atrophy noted Sensory: Pinprick and light touch intact throughout, bilaterally Deep Tendon Reflexes: 3+ and symmetric patellar, 2+ achilles. Plantars: Right: downgoing   Left: downgoing Cerebellar: normal finger-to-nose, normal rapid alternating movements and normal heel-to-shin test Gait: deferred   Lab Results: Basic Metabolic Panel: Recent Labs  Lab 10/14/20 1807 10/15/20 0813  NA 140 139  K 3.1* 3.1*  CL 106 108  CO2 25 24  GLUCOSE 93 117*  BUN 11 8  CREATININE 0.74 0.79  CALCIUM 9.3 9.1    CBC: Recent Labs  Lab 10/14/20 1807 10/15/20 0813  WBC 9.6 7.8  NEUTROABS 4.9  --   HGB 14.1 12.6  HCT 42.8 36.4  MCV 93.2 91.2  PLT 331 293    Lipid Panel: Recent Labs  Lab 10/15/20 0345  CHOL 179  TRIG 290*  HDL 26*  CHOLHDL 6.9  VLDL 58*  LDLCALC 95    Imaging:  CTA Head Neck w wo contrast 2/21/20222 IMPRESSION: 1. Negative for intracranial large vessel occlusion 2. Negative CT head 3. No significant carotid or vertebral artery stenosis in the neck. Fibromuscular dysplasia right internal carotid artery.  MR Brain 10/15/2020 IMPRESSION: Normal MRI head without contrast. Mild mucosal edema paranasal sinuses.  Assessment: 55 year old female presented with blurred vision, nausea, dizziness and headache and admitted for stroke work-up.  Denies feeling weak on one side, losing consciousness, or similar episodes in the past. Patient's symptoms are improving. -On examination today patient has decreased visual acuity bilaterally, left homonymous hemianopsia and bilateral horizontal nystagmus (3-4 beats, most likely physiological).  Also, noted to  have brisk symmetrical reflexes.  Normal muscle strength, sensation in all 4 extremities. Symptoms are improving and symptoms are changing/confusing at times. -Out of tPA  window, and no LVO so, no thrombectomy indicated.  -UDS negative -Negative CT head -CTA head negative for intracranial large vessel occlusion. -CTA neck does not show any significant carotid or vertebral artery stenosis.  Fibromuscular dysplasia right internal carotid artery noted. -MR Brain does not show any acute abnormality.   Impression: Transient ischemic attack (TIA) vs Complicated migraine (h/o chronic migraine and other headaches after rocky mountain spotted fever many years ago)  Recommendations: -Follow up Echocardiogram -LDL goal <70, continue Crestor 20 mg (started in December) -PT/OT consult -Telemetry monitoring -Frequent Neuro-checks -Start Aspirin 81 mg & Plavix 75 mg, continue for 3 weeks and then continue ASA alone. -Stroke team will follow up.  Arnoldo Lenis, MD PGY-1, Resident   Attending addendum Patient seen and examined in consultation for concern for left homonymous hemianopsia. Agree with the history and physical documented by resident physician Dr. Gerarda Fraction.  I independently performed history and physical and have made the necessary changes to the recommendation above. Briefly, she reports blurred vision, nausea, lightheadedness and dizziness 9:30 AM 10/14/2020 prior to which she was well. Never had such symptoms in the past.  Has a history of migraines.  Headaches resolved about 10 years ago before resurfacing about a month and a half and now she is getting about 3-4 headaches every week.  Yesterday's episode also was followed by headache. She was noted to have left-sided sensory deficits, some left-sided weakness and left anonymous hemianopsia yesterday at [facility] and was transferred over to [facility] for a formal neurological consultation when the CT head and the CT angio head and neck did not show any evidence of evolving stroke or large vessel occlusion. On examination, she is awake alert oriented x3, no dysarthria, no evidence of  aphasia, cranial nerve examination shows somewhat constricted left visual field in both eyes but not very impressive, no other cranial nerve deficits.  Motor and sensory examination unremarkable. I have personally reviewed imaging-MRI brain negative for acute stroke. Working differentials complex migraine versus TIA.  Recommendations as above. Stroke team will follow.  -- Milon Dikes, MD Neurologist [facility] Pager: [phone]

## 2020-10-15 NOTE — Evaluation (Signed)
Occupational Therapy Evaluation Patient Details Name: [name] MRN: [id number] DOB: 1966/06/28 Today's Date: 10/15/2020    History of Present Illness 55 y.o. female presenting with worsening headache, dizziness, blurry vision and nausea without emesis. Patient admitted with suspicion for TIA. CT angio head/neck (-) for acute intracranial abnormality. No significant carotid or vertebral artery stenosis noted. Further work-up pending. PMHx significant for HTN, HLD, and thyroid disease.   Clinical Impression   PTA patient was living in a private residence and was independent with ADLs/IADLs including working as an Airline pilot for [employer]. Patient presents with deficits in LUE coordination, dysmetria, and mild balance deficits indicating need for continued acute OT services in prep for safe d/c home. Patient functioning at overall supervision A for observed ADLs and ADL transfers this date. Patient notes dizziness with change in position reporting recent weight loss of up to 20lbs. Patient is on BP medication at baseline but reports that her medications have not been adjusted since her weight loss. Education on taking increased time with change in position to decrease risk of syncope. OT also provided education on monitoring BP at home and following up with PCP post d/c. OT will continue to follow acutely.     Follow Up Recommendations  No OT follow up    Equipment Recommendations  None recommended by OT    Recommendations for Other Services       Precautions / Restrictions Precautions Precautions: None Restrictions Weight Bearing Restrictions: No      Mobility Bed Mobility Overal bed mobility: Modified Independent             General bed mobility comments: Increased time, no assist needed.    Transfers Overall transfer level: Needs assistance Equipment used: None Transfers: Sit to/from Stand Sit to Stand: Min guard         General transfer comment: Min guard  for safety. Stood from [employer], stood from toilet x1. Slow to rise with minimal head movements    Balance Overall balance assessment: Needs assistance Sitting-balance support: Feet supported;No upper extremity supported Sitting balance-Leahy Scale: Good Sitting balance - Comments: Able to reach down and adjust socks without difficulty.   Standing balance support: During functional activity Standing balance-Leahy Scale: Fair Standing balance comment: Close min guard for safety due to blurry vision.                           ADL either performed or assessed with clinical judgement   ADL Overall ADL's : Needs assistance/impaired     Grooming: Supervision/safety;Standing Grooming Details (indicate cue type and reason): Supervision A for safety 2/2 mild dizziness.             Lower Body Dressing: Supervision/safety;Sit to/from stand Lower Body Dressing Details (indicate cue type and reason): Able to doff/don footwear without external assist. Toilet Transfer: Supervision/safety Toilet Transfer Details (indicate cue type and reason): Supervision A for safety. Simulated with transfer to EOB. No external assist required.         Functional mobility during ADLs: Supervision/safety General ADL Comments: Patient limited by dizziness with change in position, continued blurry vision and mild coordination deficits in LUE.     Vision Baseline Vision/History: No visual deficits Patient Visual Report: Blurring of vision Vision Assessment?: Yes Eye Alignment: Within Functional Limits Ocular Range of Motion: Within Functional Limits Alignment/Gaze Preference: Within Defined Limits Tracking/Visual Pursuits: Able to track stimulus in all quads without difficulty Saccades: Within functional limits  Visual Fields: No apparent deficits Additional Comments: Notes continued blurry vision greatest with change in position along with dizziness.     Perception     Praxis       Pertinent Vitals/Pain Pain Assessment: No/denies pain     Hand Dominance Left   Extremity/Trunk Assessment Upper Extremity Assessment Upper Extremity Assessment: Defer to OT evaluation LUE Deficits / Details: Impaired finger to nose test and opposition LUE Sensation: WNL LUE Coordination: decreased fine motor;decreased gross motor   Lower Extremity Assessment Lower Extremity Assessment: Overall WFL for tasks assessed   Cervical / Trunk Assessment Cervical / Trunk Assessment: Normal   Communication Communication Communication: No difficulties   Cognition Arousal/Alertness: Awake/alert Behavior During Therapy: WFL for tasks assessed/performed Overall Cognitive Status: Within Functional Limits for tasks assessed                                     General Comments  Significant other present during session. Neurologist came in during session. Pt reports blurry vision and also difficulty seeing out of left visual field in periphery as well as nasal portion of left eye. needs to move head to find finger. Able to reach clock on wall and board but blurry. Reports occasional tinnitus in ear left>right which is not new with some hearing loss in left ear at baseline.    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 independent for ADls/IADLs.        OT Problem List: Impaired  balance (sitting and/or standing);Decreased coordination      OT Treatment/Interventions: Self-care/ADL training;Therapeutic exercise;Neuromuscular education;Patient/family education;Balance training    OT Goals(Current goals can be found in the care plan section) Acute Rehab OT Goals Patient Stated Goal: to get better OT Goal Formulation: With patient Time For Goal Achievement: 10/29/20 Potential to Achieve Goals: Good  OT Frequency: Min 2X/week   Barriers to D/C:            Co-evaluation              AM-PAC OT "6 Clicks" Daily Activity     Outcome Measure Help from another person eating meals?: None Help from another person taking care of personal grooming?: None Help from another person toileting, which includes using toliet, bedpan, or urinal?: None Help from another person bathing (including washing, rinsing, drying)?: None Help from another person to put on and taking off regular upper body clothing?: None Help from another person to put on and taking off regular lower body clothing?: None 6 Click Score: 24   End of Session Equipment Utilized During Treatment: Gait belt Nurse Communication: Mobility status  Activity Tolerance: Patient tolerated treatment well Patient left: in bed;with call bell/phone within reach;with bed alarm set  OT Visit Diagnosis: Unsteadiness on feet (R26.81);Muscle weakness (generalized) (M62.81)  Time: 3532-9924 OT Time Calculation (min): 24 min Charges:  OT General Charges $OT Visit: 1 Visit OT Evaluation $OT Eval Low Complexity: 1 Low OT Treatments $Self Care/Home Management : 8-22 mins  Mayson Sterbenz H. OTR/L Supplemental OT, Department of rehab services [phone]  Daveon Arpino R H. 10/15/2020, 1:20 PM

## 2020-10-15 NOTE — CV Procedure (Signed)
2D echo attempted, but patient not in room. Will try later °

## 2020-10-15 NOTE — CV Procedure (Signed)
2D echo attempted, patient not in room. Will try later 

## 2020-10-15 NOTE — ED Notes (Signed)
[facility] in room to transport patient to [facility]. No acute distress noted.

## 2020-10-16 ENCOUNTER — Other Ambulatory Visit: Payer: Self-pay | Admitting: Family Medicine

## 2020-10-16 DIAGNOSIS — E876 Hypokalemia: Secondary | ICD-10-CM | POA: Diagnosis not present

## 2020-10-16 DIAGNOSIS — G459 Transient cerebral ischemic attack, unspecified: Secondary | ICD-10-CM | POA: Diagnosis not present

## 2020-10-16 LAB — BASIC METABOLIC PANEL
Anion gap: 9 (ref 5–15)
BUN: 10 mg/dL (ref 6–20)
CO2: 24 mmol/L (ref 22–32)
Calcium: 9.1 mg/dL (ref 8.9–10.3)
Chloride: 109 mmol/L (ref 98–111)
Creatinine, Ser: 0.72 mg/dL (ref 0.44–1.00)
GFR, Estimated: >60 mL/min (ref >60)
Glucose, Bld: 92 mg/dL (ref 70–99)
Potassium: 3.8 mmol/L (ref 3.5–5.1)
Sodium: 142 mmol/L (ref 135–145)

## 2020-10-16 LAB — TSH: TSH: 3.802 u[IU]/mL (ref 0.350–4.500)

## 2020-10-16 MED ORDER — BLOOD PRESSURE KIT: 1.0000 | PACK | Freq: Every day | 0 refills | Status: DC | PRN

## 2020-10-16 MED ORDER — ASPIRIN 81 MG PO TBEC: 81.0000 mg | DELAYED_RELEASE_TABLET | Freq: Every day | ORAL | 11 refills | Status: AC

## 2020-10-16 MED ORDER — BLOOD PRESSURE KIT: 1.0000 | PACK | Freq: Every day | 0 refills | Status: AC | PRN

## 2020-10-16 MED FILL — ASPIRIN LOW DOSE 81 MG TBEC: 81 | 30 days supply | Qty: 30 | Fill #0

## 2020-10-16 NOTE — TOC Transition Note (Signed)
Transition of Care [facility]) - CM/SW Discharge Note   Patient Details  Name: [name] MRN: [id number] Date of Birth: 08/30/1965  [facility] CM/SW Contact:  Kermit Balo, RN Phone Number: 10/16/2020, 3:19 PM   Clinical Narrative:    Pt discharging home with self care. No f/u per PT/OT and no DME needs.  TOC signing off.   Final next level of care: Home/Self Care Barriers to Discharge: No Barriers Identified   Patient Goals and CMS Choice        Discharge Placement                       Discharge Plan and Services                                     Social Determinants of Health (SDOH) Interventions     Readmission Risk Interventions No flowsheet data found.

## 2020-10-16 NOTE — Discharge Summary (Signed)
Physician Discharge Summary  [name] ZCH:[id number] DOB: September 01, 1965 DOA: 10/14/2020  PCP: Candice Banker, PA  Admit date: 10/14/2020 Discharge date: 10/16/2020  Time spent: 40 minutes  Recommendations for Outpatient Follow-up:  1. Follow outpatient CBC/CMP 2. Follow blood pressure outpatient (meds d/c'd here as BP reasonable off meds, pt with significant recent weight loss) 3. follow up with ophthalmology outpatient 4. Follow up with neurology outpatient  5. Concern for left eye focal disorder vs complex migraine, less likely TIA - follow with ophtho/neuro outpatient 6. Follow repeat LDL on crestor outpatient  7. Follow fibromuscular dysplasia outpatient 8. Prediabetes, follow outpatient   Discharge Diagnoses:  Principal Problem:   TIA (transient ischemic attack) Active Problems:   Hypothyroidism   Hyperlipidemia   Hypokalemia   Discharge Condition: stable  Diet recommendation: heart healthy  Filed Weights   10/14/20 1732  Weight: 77.1 kg    History of present illness:  Patient is [name] 55 year old female with history of hypertension, hypothyroidism, hyperlipidemia presented to [facility] with sudden blurry vision, nausea and headache that started between 9:30 AM to 10 AM on 2/21.  Patient reported that she was working remotely from home on her computer when she suddenly noticed that her vision was becoming more blurry.  She uses reading glasses, cleaned them however no improvement.  Then she started having dizziness, exacerbated on standing up.  Patient also reported episodes of vertigo as well.  Patient reported that her symptoms progressively continued to worsen and she developed [name] headache around 3:30 PM.  No vomiting however had nausea, no slurred speech, confusion or syncope. Patient reports no prior history of TIA/strokes before.  Also reported off-and-on tinnitus and chronic hearing loss which is not new.  Patient presented to [facility], then subsequently transferred to [facility] for stroke work-up. At the time of my examination, dizziness and blurred vision has somewhat improved, still having mild headache.  She was admitted with concern for complex migraine vs TIA.  She's improved on 2/23, day of discharge.  Seen by neurology recommending outpatient ophthalmology follow up, neurology follow up.    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) - echo with normal EF (see report) -LDL 95, continue statin, aspirin - follow repeat LDL in [name] few weeks, recently started on crestor last month -Hemoglobin A1c 5.7 -follow with neurology and ophtho outpatient   Hypertension BP reasonable, she's had significant weight loss outpatient She's requesting to follow BP off meds Reasonable, will follow outpatient     Hypothyroidism -Follow TSH (wnl), continue Synthroid    Hyperlipidemia -LDL 95, continue statin (follow repeat LDL as noted above)    Hypokalemia -follow outpatient  Fibromuscular dysplasia Follow outpatient  Procedures: Echo IMPRESSIONS    1. Left ventricular ejection fraction, by estimation, is 60 to 65%. The  left ventricle has normal function. The left ventricle has no regional  wall motion abnormalities. There is mild concentric left ventricular  hypertrophy. Left ventricular diastolic  parameters were normal.  2. Right ventricular systolic function is normal. The right ventricular  size is normal. There is normal pulmonary artery systolic pressure.  3. The mitral valve is normal in structure. Trivial mitral valve  regurgitation. No evidence of mitral stenosis.  4. Tricuspid  valve regurgitation is mild to moderate.  5. The aortic valve is  grossly normal. Aortic valve regurgitation is not  visualized. No aortic stenosis is present.  6. The inferior vena cava is normal in size with greater than 50%  respiratory variability, suggesting right atrial pressure of 3 mmHg.   Comparison(s): No prior Echocardiogram.   Conclusion(s)/Recommendation(s): Normal biventricular function without  evidence of hemodynamically significant valvular heart disease. No  intracardiac source of embolism detected on this transthoracic study. [name]  transesophageal echocardiogram is  recommended to exclude cardiac source of embolism if clinically indicated.   Consultations:  Neurology  Discharge Exam: Vitals:   10/16/20 0835 10/16/20 1124  BP: 118/72 130/71  Pulse: 63 60  Resp: 18 16  Temp: 97.7 F (36.5 C) 97.6 F (36.4 C)  SpO2: 99% 100%   Feels better Sig other at bedside Long discussion about follow up plan No new complaints  General: No acute distress. Cardiovascular: Heart sounds show [name] regular rate, and rhythm.  Lungs: Clear to auscultation bilaterally Abdomen: Soft, nontender, nondistended  Neurological: Alert and oriented 3. Moves all extremities 4 with equal strength. Cranial nerves II through XII intact. Skin: Warm and dry. No rashes or lesions. Extremities: No clubbing or cyanosis. No edema.   Discharge Instructions   Discharge Instructions    Ambulatory referral to Neurology   Complete by: As directed    Follow up for complicated migraines.   Call MD for:  difficulty breathing, headache or visual disturbances   Complete by: As directed    Call MD for:  extreme fatigue   Complete by: As directed    Call MD for:  hives   Complete by: As directed    Call MD for:  persistant dizziness or light-headedness   Complete by: As directed    Call MD for:  persistant nausea and vomiting   Complete by: As directed    Call MD for:  redness, tenderness, or signs of infection (pain, swelling, redness, odor or green/yellow  discharge around incision site)   Complete by: As directed    Call MD for:  severe uncontrolled pain   Complete by: As directed    Call MD for:  temperature >100.4   Complete by: As directed    Diet - low sodium heart healthy   Complete by: As directed    Discharge instructions   Complete by: As directed    You were seen for [name] complex migraine or TIA.  We think this was most likely Oriel Rumbold complex migraine, but Puneet Selden TIA (ministroke is difficult to exclude completely).  We'll send you home on aspirin.  Continue your crestor.  Please follow up with your eye doctor (ophthalmologist) as soon as possible.  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  Please follow this up outpatient with your PCP.  Since your blood pressure has been appropriate here.  We'll trial you off your blood pressure medicines.  If your blood pressures are consistently over 130/80 (top or bottom number), please resume your HCTZ and follow up with your PCP.  You'll need repeat labs within 1-2 weeks.  Return for new, recurrent, or worsening symptoms.  Please ask your PCP to request records from this hospitalization so they know what was done and what the next steps will be.   Increase activity slowly   Complete by: As directed      Allergies as of 10/16/2020      Reactions   Penicillins Anaphylaxis  Codeine Nausea And Vomiting      Medication List    STOP taking these medications   fluticasone 50 MCG/ACT nasal spray Commonly known as: FLONASE   hydrochlorothiazide 12.5 MG tablet Commonly known as: HYDRODIURIL   ibuprofen 200 MG tablet Commonly known as: ADVIL   lisinopril 10 MG tablet Commonly known as: ZESTRIL   naproxen sodium 220 MG tablet Commonly known as: ALEVE   nitrofurantoin (macrocrystal-monohydrate) 100 MG capsule Commonly known as: MACROBID   oxyCODONE-acetaminophen 5-325 MG tablet Commonly known as: PERCOCET/ROXICET     TAKE these medications   aspirin 81 MG EC  tablet Take 1 tablet (81 mg total) by mouth daily. Swallow whole. Start taking on: October 17, 2020   Blood Pressure Kit 1 kit by Does not apply route daily as needed (blood pressure check).   levothyroxine 75 MCG tablet Commonly known as: SYNTHROID Take 75 mcg by mouth daily before breakfast.   rosuvastatin 20 MG tablet Commonly known as: CRESTOR Take 20 mg by mouth daily.      Allergies  Allergen Reactions  . Penicillins Anaphylaxis  . Codeine Nausea And Vomiting    Follow-up Information    [facility]. Schedule an appointment as soon as possible for Alden Bensinger visit in 4 week(s).   Specialty: Neurology Contact information: [address] [place] [phone]               The results of significant diagnostics from this hospitalization (including imaging, microbiology, ancillary and laboratory) are listed below for reference.    Significant Diagnostic Studies: CT Angio Head W or Wo Contrast  Result Date: 10/14/2020 CLINICAL DATA:  Acute neuro deficit.  Headache.  Difficulty walking. EXAM: CT ANGIOGRAPHY HEAD AND NECK TECHNIQUE: Multidetector CT imaging of the head and neck was performed using the standard protocol during bolus administration of intravenous contrast. Multiplanar CT image reconstructions and MIPs were obtained to evaluate the vascular anatomy. Carotid stenosis measurements (when applicable) are obtained utilizing NASCET criteria, using the distal internal carotid diameter as the denominator. CONTRAST:  151m OMNIPAQUE IOHEXOL 350 MG/ML SOLN COMPARISON:  CT head 01/12/2010 FINDINGS: CT HEAD FINDINGS Brain: No evidence of acute infarction, hemorrhage, hydrocephalus, extra-axial collection or mass lesion/mass effect. Vascular: Negative for hyperdense vessel Skull: Negative Sinuses: Paranasal sinuses clear. Orbits: Negative Review of the MIP images confirms the above findings CTA NECK FINDINGS Aortic arch: Normal  aortic arch. Proximal great vessels widely patent. Right carotid system: Negative for stenosis or atherosclerotic disease. 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 No dissection or aneurysm. Left carotid system: Normal left carotid without stenosis. Vertebral arteries: Normal vertebral arteries bilaterally. Skeleton: Cervical spondylosis most notably C5-6. Negative for acute skeletal abnormality. Other neck: Negative for mass or adenopathy in the neck. Upper chest: Lung apices clear bilaterally. Review of the MIP images confirms the above findings CTA HEAD FINDINGS Anterior circulation: Cavernous carotid normal bilaterally. Anterior and middle cerebral arteries normal bilaterally. Posterior circulation: Both vertebral arteries patent to the basilar. Posterior circulation normal without stenosis or large vessel occlusion. Venous sinuses: Normal venous enhancement. Anatomic variants: None Review of the MIP images confirms the above findings IMPRESSION: 1. Negative for intracranial large vessel occlusion 2. Negative CT head 3. No significant carotid or vertebral artery stenosis in the neck. Fibromuscular dysplasia right internal carotid artery. Electronically Signed   By: CFranchot GalloM.D.   On: 10/14/2020 19:33   CT Angio Neck W and/or Wo Contrast  Result Date: 10/14/2020 CLINICAL DATA:  Acute neuro deficit.  Headache.  Difficulty walking. EXAM: CT ANGIOGRAPHY HEAD AND NECK TECHNIQUE: Multidetector CT imaging of the head and neck was performed using the standard protocol during bolus administration of intravenous contrast. Multiplanar CT image reconstructions and MIPs were obtained to evaluate the vascular anatomy. Carotid stenosis measurements (when applicable) are obtained utilizing NASCET criteria, using the distal internal carotid diameter as the denominator. CONTRAST:  169m OMNIPAQUE IOHEXOL 350 MG/ML SOLN COMPARISON:  CT head 01/12/2010 FINDINGS: CT HEAD FINDINGS  Brain: No evidence of acute infarction, hemorrhage, hydrocephalus, extra-axial collection or mass lesion/mass effect. Vascular: Negative for hyperdense vessel Skull: Negative Sinuses: Paranasal sinuses clear. Orbits: Negative Review of the MIP images confirms the above findings CTA NECK FINDINGS Aortic arch: Normal aortic arch. Proximal great vessels widely patent. Right carotid system: Negative for stenosis or atherosclerotic disease. 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 No dissection or aneurysm. Left carotid system: Normal left carotid without stenosis. Vertebral arteries: Normal vertebral arteries bilaterally. Skeleton: Cervical spondylosis most notably C5-6. Negative for acute skeletal abnormality. Other neck: Negative for mass or adenopathy in the neck. Upper chest: Lung apices clear bilaterally. Review of the MIP images confirms the above findings CTA HEAD FINDINGS Anterior circulation: Cavernous carotid normal bilaterally. Anterior and middle cerebral arteries normal bilaterally. Posterior circulation: Both vertebral arteries patent to the basilar. Posterior circulation normal without stenosis or large vessel occlusion. Venous sinuses: Normal venous enhancement. Anatomic variants: None Review of the MIP images confirms the above findings IMPRESSION: 1. Negative for intracranial large vessel occlusion 2. Negative CT head 3. No significant carotid or vertebral artery stenosis in the neck. Fibromuscular dysplasia right internal carotid artery. Electronically Signed   By: CFranchot GalloM.D.   On: 10/14/2020 19:33   MR BRAIN WO CONTRAST  Result Date: 10/15/2020 CLINICAL DATA:  TIA. Hypertension hyperlipidemia. Blurred vision nausea headache. EXAM: MRI HEAD WITHOUT CONTRAST TECHNIQUE: Multiplanar, multiecho pulse sequences of the brain and surrounding structures were obtained without intravenous contrast. COMPARISON:  CT angio head and neck 10/14/2020 FINDINGS:  Brain: No acute infarction, hemorrhage, hydrocephalus, extra-axial collection or mass lesion. Normal white matter. No evidence of demyelinating disease. Vascular: Normal arterial flow voids. Skull and upper cervical spine: Negative Sinuses/Orbits: Mild mucosal edema paranasal sinuses. Negative orbit Other: None IMPRESSION: Normal MRI head without contrast. Mild mucosal edema paranasal sinuses. Electronically Signed   By: CFranchot GalloM.D.   On: 10/15/2020 11:16   ECHOCARDIOGRAM COMPLETE  Result Date: 10/15/2020    ECHOCARDIOGRAM REPORT   Patient Name:   JTACCARA BUSHNELLDate of Exam: 10/15/2020 Medical Rec #:  [id number]       Height:       59.0 in Accession #:    [id number]      Weight:       170.0 lb Date of Birth:  1Apr 24, 1967      BSA:          1.721 m Patient Age:    89years         BP:           112/68 mmHg Patient Gender: F                HR:           58 bpm. Exam Location:  Inpatient Procedure: 2D Echo, 3D Echo, Cardiac Doppler and Color Doppler Indications:    TIA  History:  Patient has no prior history of Echocardiogram examinations.                 TIA, Signs/Symptoms:Dizziness/Lightheadedness; Risk                 Factors:Hypertension and Dyslipidemia. Covid infection 9/21.  Sonographer:    Roseanna Rainbow RDCS Referring Phys: [phone] RIPUDEEP K RAI IMPRESSIONS  1. Left ventricular ejection fraction, by estimation, is 60 to 65%. The left ventricle has normal function. The left ventricle has no regional wall motion abnormalities. There is mild concentric left ventricular hypertrophy. Left ventricular diastolic parameters were normal.  2. Right ventricular systolic function is normal. The right ventricular size is normal. There is normal pulmonary artery systolic pressure.  3. The mitral valve is normal in structure. Trivial mitral valve regurgitation. No evidence of mitral stenosis.  4. Tricuspid valve regurgitation is mild to moderate.  5. The aortic valve is grossly normal. Aortic valve  regurgitation is not visualized. No aortic stenosis is present.  6. The inferior vena cava is normal in size with greater than 50% respiratory variability, suggesting right atrial pressure of 3 mmHg. Comparison(s): No prior Echocardiogram. Conclusion(s)/Recommendation(s): Normal biventricular function without evidence of hemodynamically significant valvular heart disease. No intracardiac source of embolism detected on this transthoracic study. Syndi Pua transesophageal echocardiogram is recommended to exclude cardiac source of embolism if clinically indicated. FINDINGS  Left Ventricle: Left ventricular ejection fraction, by estimation, is 60 to 65%. The left ventricle has normal function. The left ventricle has no regional wall motion abnormalities. The left ventricular internal cavity size was normal in size. There is  mild concentric left ventricular hypertrophy. Left ventricular diastolic parameters were normal. Right Ventricle: The right ventricular size is normal. No increase in right ventricular wall thickness. Right ventricular systolic function is normal. There is normal pulmonary artery systolic pressure. The tricuspid regurgitant velocity is 2.03 m/s, and  with an assumed right atrial pressure of 3 mmHg, the estimated right ventricular systolic pressure is 62.9 mmHg. Left Atrium: Left atrial size was normal in size. Right Atrium: Right atrial size was normal in size. Pericardium: There is no evidence of pericardial effusion. Mitral Valve: The mitral valve is normal in structure. Trivial mitral valve regurgitation. No evidence of mitral valve stenosis. Tricuspid Valve: The tricuspid valve is normal in structure. Tricuspid valve regurgitation is mild to moderate. No evidence of tricuspid stenosis. Aortic Valve: The aortic valve is grossly normal. Aortic valve regurgitation is not visualized. No aortic stenosis is present. Pulmonic Valve: The pulmonic valve was not well visualized. Pulmonic valve regurgitation is not  visualized. Aorta: The aortic root, ascending aorta, aortic arch and descending aorta are all structurally normal, with no evidence of dilitation or obstruction. Venous: The inferior vena cava is normal in size with greater than 50% respiratory variability, suggesting right atrial pressure of 3 mmHg. IAS/Shunts: The atrial septum is grossly normal.  LEFT VENTRICLE PLAX 2D LVIDd:         3.10 cm     Diastology LVIDs:         2.20 cm     LV e' medial:    5.77 cm/s LV PW:         1.40 cm     LV E/e' medial:  11.4 LV IVS:        1.20 cm     LV e' lateral:   9.90 cm/s LVOT diam:     1.60 cm     LV E/e' lateral: 6.6 LV SV:  44 LV SV Index:   25 LVOT Area:     2.01 cm  LV Volumes (MOD) LV vol d, MOD A2C: 46.1 ml LV vol d, MOD A4C: 40.3 ml LV vol s, MOD A2C: 13.3 ml LV vol s, MOD A4C: 12.5 ml LV SV MOD A2C:     32.8 ml LV SV MOD A4C:     40.3 ml LV SV MOD BP:      30.4 ml RIGHT VENTRICLE            IVC RV S prime:     9.46 cm/s  IVC diam: 1.70 cm TAPSE (M-mode): 1.9 cm LEFT ATRIUM             Index       RIGHT ATRIUM          Index LA diam:        3.20 cm 1.86 cm/m  RA Area:     9.69 cm LA Vol (A2C):   22.3 ml 12.96 ml/m RA Volume:   17.10 ml 9.93 ml/m LA Vol (A4C):   22.7 ml 13.19 ml/m LA Biplane Vol: 23.8 ml 13.83 ml/m  AORTIC VALVE LVOT Vmax:   97.50 cm/s LVOT Vmean:  70.100 cm/s LVOT VTI:    0.217 m  AORTA Ao Root diam: 2.90 cm Ao Asc diam:  2.30 cm MITRAL VALVE               TRICUSPID VALVE MV Area (PHT): 3.91 cm    TR Peak grad:   16.5 mmHg MV Decel Time: 194 msec    TR Vmax:        203.00 cm/s MV E velocity: 65.50 cm/s MV Harun Brumley velocity: 40.10 cm/s  SHUNTS MV E/Aviyon Hocevar ratio:  1.63        Systemic VTI:  0.22 m                            Systemic Diam: 1.60 cm Buford Dresser MD Electronically signed by Buford Dresser MD Signature Date/Time: 10/15/2020/5:15:06 PM    Final     Microbiology: Recent Results (from the past 240 hour(s))  Resp Panel by RT-PCR (Flu Devaeh Amadi&B, Covid) Urine, Clean Catch      Status: None   Collection Time: 10/14/20  7:20 PM   Specimen: Urine, Clean Catch; Nasopharyngeal(NP) swabs in vial transport medium  Result Value Ref Range Status   SARS Coronavirus 2 by RT PCR NEGATIVE NEGATIVE Final    Comment: (NOTE) SARS-CoV-2 target nucleic acids are NOT DETECTED.  The SARS-CoV-2 RNA is generally detectable in upper respiratory specimens during the acute phase of infection. The lowest concentration of SARS-CoV-2 viral copies this assay can detect is 138 copies/mL. Iretha Kirley negative result does not preclude SARS-Cov-2 infection and should not be used as the sole basis for treatment or other patient management decisions. Jakeob Tullis negative result may occur with  improper specimen collection/handling, submission of specimen other than nasopharyngeal swab, presence of viral mutation(s) within the areas targeted by this assay, and inadequate number of viral copies(<138 copies/mL). Jaelin Devincentis negative result must be combined with clinical observations, patient history, and epidemiological information. The expected result is Negative.  Fact Sheet for Patients:  EntrepreneurPulse.com.au  Fact Sheet for Healthcare Providers:  IncredibleEmployment.be  This test is no t yet approved or cleared by the [place] FDA and  has been authorized for detection and/or diagnosis of SARS-CoV-2 by FDA under an Emergency Use Authorization (EUA). This EUA  will remain  in effect (meaning this test can be used) for the duration of the COVID-19 declaration under Section 564(b)(1) of the Act, 21 U.S.C.section 360bbb-3(b)(1), unless the authorization is terminated  or revoked sooner.       Influenza Oviya Ammar by PCR NEGATIVE NEGATIVE Final   Influenza B by PCR NEGATIVE NEGATIVE Final    Comment: (NOTE) The Xpert Xpress SARS-CoV-2/FLU/RSV plus assay is intended as an aid in the diagnosis of influenza from Nasopharyngeal swab specimens and should not be used as Naksh Radi sole basis for  treatment. Nasal washings and aspirates are unacceptable for Xpert Xpress SARS-CoV-2/FLU/RSV testing.  Fact Sheet for Patients: EntrepreneurPulse.com.au  Fact Sheet for Healthcare Providers: IncredibleEmployment.be  This test is not yet approved or cleared by the [place] FDA and has been authorized for detection and/or diagnosis of SARS-CoV-2 by FDA under an Emergency Use Authorization (EUA). This EUA will remain in effect (meaning this test can be used) for the duration of the COVID-19 declaration under Section 564(b)(1) of the Act, 21 U.S.C. section 360bbb-3(b)(1), unless the authorization is terminated or revoked.  Performed at [facility], [address] [place], [place] [address]      Labs: Basic Metabolic Panel: Recent Labs  Lab 10/14/20 1807 10/15/20 0813 10/16/20 0414  NA 140 139 142  K 3.1* 3.1* 3.8  CL 106 108 109  CO2 25 24 24   GLUCOSE 93 117* 92  BUN 11 8 10   CREATININE 0.74 0.79 0.72  CALCIUM 9.3 9.1 9.1   Liver Function Tests: No results for input(s): AST, ALT, ALKPHOS, BILITOT, PROT, ALBUMIN in the last 168 hours. No results for input(s): LIPASE, AMYLASE in the last 168 hours. No results for input(s): AMMONIA in the last 168 hours. CBC: Recent Labs  Lab 10/14/20 1807 10/15/20 0813  WBC 9.6 7.8  NEUTROABS 4.9  --   HGB 14.1 12.6  HCT 42.8 36.4  MCV 93.2 91.2  PLT 331 293   Cardiac Enzymes: No results for input(s): CKTOTAL, CKMB, CKMBINDEX, TROPONINI in the last 168 hours. BNP: BNP (last 3 results) No results for input(s): BNP in the last 8760 hours.  ProBNP (last 3 results) No results for input(s): PROBNP in the last 8760 hours.  CBG: No results for input(s): GLUCAP in the last 168 hours.     Signed:  Fayrene Helper MD.  [facility] 10/16/2020, 9:11 PM

## 2020-10-16 NOTE — Progress Notes (Signed)
Physical Therapy Treatment Patient Details Name: [name] MRN: [id number] DOB: [birth year] Today's Date: 10/16/2020    History of Present Illness 55 y.o. female presenting with worsening headache, dizziness, blurry vision and nausea without emesis. Patient admitted with suspicion for TIA. CT angio head/neck (-) for acute intracranial abnormality. No significant carotid or vertebral artery stenosis noted. Further work-up pending. PMHx significant for HTN, HLD, and thyroid disease.    PT Comments    Pt tolerates treatment well, ambulating for community distances independently at this time. Pt reports resolution of dizziness and demonstrates improvement in balance, performing multiple dynamic gait tasks. Pt is mobilizing near her baseline at this time other than R knee pain from an exacerbation of a chronic R knee injury. Pt requires no further acute PT services. Acute PT signing off.   Follow Up Recommendations  No PT follow up;Supervision - Intermittent     Equipment Recommendations  None recommended by PT    Recommendations for Other Services       Precautions / Restrictions Precautions Precautions: None Restrictions Weight Bearing Restrictions: No    Mobility  Bed Mobility Overal bed mobility: Modified Independent                  Transfers Overall transfer level: Independent                  Ambulation/Gait Ambulation/Gait assistance: Independent Gait Distance (Feet): 400 Feet Assistive device: None Gait Pattern/deviations: Step-through pattern Gait velocity: functional Gait velocity interpretation: >2.62 ft/sec, indicative of community ambulatory General Gait Details: pt is able to step perform head turns, change gait speed, stop abruptly and change directions without loss of balance   Stairs Stairs: Yes Stairs assistance: Supervision Stair Management: One rail Right;Alternating pattern Number of Stairs: 4 General stair comments: limited by  IV line length   Wheelchair Mobility    Modified Rankin (Stroke Patients Only) Modified Rankin (Stroke Patients Only) Pre-Morbid Rankin Score: No symptoms Modified Rankin: Slight disability     Balance Overall balance assessment: Needs assistance Sitting-balance support: No upper extremity supported;Feet supported Sitting balance-Leahy Scale: Normal     Standing balance support: No upper extremity supported;During functional activity Standing balance-Leahy Scale: Good                              Cognition Arousal/Alertness: Awake/alert Behavior During Therapy: WFL for tasks assessed/performed Overall Cognitive Status: Within Functional Limits for tasks assessed                                        Exercises      General Comments General comments (skin integrity, edema, etc.): VSS on RA      Pertinent Vitals/Pain Pain Assessment: Faces Faces Pain Scale: Hurts little more Pain Location: R knee Pain Descriptors / Indicators: Grimacing Pain Intervention(s): Monitored during session    Home Living     Available Help at Discharge: Family;Available PRN/intermittently Type of Home: House              Prior Function            PT Goals (current goals can now be found in the care plan section) Acute Rehab PT Goals Patient Stated Goal: to get better Progress towards PT goals: Goals met/education completed, patient discharged from PT    Frequency  PT Plan Current plan remains appropriate    Co-evaluation              AM-PAC PT "6 Clicks" Mobility   Outcome Measure  Help needed turning from your back to your side while in a flat bed without using bedrails?: None Help needed moving from lying on your back to sitting on the side of a flat bed without using bedrails?: None Help needed moving to and from a bed to a chair (including a wheelchair)?: None Help needed standing up from a chair using your arms  (e.g., wheelchair or bedside chair)?: None Help needed to walk in hospital room?: None Help needed climbing 3-5 steps with a railing? : A Little 6 Click Score: 23    End of Session   Activity Tolerance: Patient tolerated treatment well Patient left: in chair;with call bell/phone within reach;with chair alarm set Nurse Communication: Mobility status PT Visit Diagnosis: Difficulty in walking, not elsewhere classified (R26.2);Unsteadiness on feet (R26.81)     Time: 6962-9528 PT Time Calculation (min) (ACUTE ONLY): 16 min  Charges:  $Gait Training: 8-22 mins                     Zenaida Niece, PT, DPT Acute Rehabilitation Pager: [phone]    Zenaida Niece 10/16/2020, 12:48 PM

## 2020-10-16 NOTE — Discharge Instructions (Signed)
Blurred Vision, Adult     Having blurred vision means that you cannot see things clearly. Your vision may seem fuzzy or out of focus. It can involve your vision for objects that are close or far away. It may affect one or both eyes. There are many causes of blurred vision, including cataracts, macular degeneration, eye inflammation (uveitis), and diabetic retinopathy. In many cases, blurred vision has to do with the shape of your eye. An abnormal eye shape means you cannot focus well (refractive error). When this happens, it can cause:  Faraway objects to look blurry (nearsightedness).  Close objects to look blurry (farsightedness).  Blurry vision at any distance (astigmatism). Refractive errors are often corrected with glasses or contacts. Blurred vision can be diagnosed based on your symptoms and Caeli Linehan physical exam. Tell your health care provider about any other health problems you have, any recent eye injury, and any prior surgeries. You may need to see Chanika Byland health care provider who specializes in eye problems (ophthalmologist). Your treatment will depend on what is causing your blurred vision. Follow these instructions at home:  Keep all follow-up visits as told by your health care provider. This is important. These include any visits to your eye specialists.  Do not drive or use heavy machinery if your vision is blurry.  Use eye drops only as told by your health care provider.  If you were prescribed glasses or contact lenses, wear the glasses or contacts as told by your health care provider.  Schedule eye exams regularly.  Pay attention to any changes in your symptoms. Contact Brittany Amirault health care provider if:  Your symptoms do not improve or they get worse.  You have: ? New symptoms. ? Jazlynne Milliner headache. ? Trouble seeing at night. ? Trouble noticing the difference between colors.  You notice: ? Drooping of your eyelids. ? Drainage coming from your eyes. ? Gerrard Crystal rash around your eyes. Get help  right away if:  You have: ? Severe eye pain. ? Aubryana Vittorio severe headache. ? Caeli Linehan sudden change in vision. ? Truth Wolaver sudden loss of vision. ? Ronika Kelson vision change after an injury.  You notice flashing lights in your field of vision. Your field of vision is the area that you can see without moving your eyes. Summary  Having blurred vision means that you cannot see things clearly. Your vision may seem fuzzy or out of focus.  There are many causes of blurred vision. In many cases, blurred vision has to do with an abnormal eye shape (refractive error), and it can be corrected with glasses or contact lenses.  Pay attention to any changes in your symptoms. Contact Marjie Chea health care provider if your symptoms do not improve or if you have any new symptoms. This information is not intended to replace advice given to you by your health care provider. Make sure you discuss any questions you have with your health care provider. Document Revised: 04/12/2020 Document Reviewed: 04/12/2020 Elsevier Patient Education  2021 [employer]  

## 2020-10-16 NOTE — Progress Notes (Addendum)
STROKE TEAM PROGRESS NOTE   INTERVAL HISTORY  Her significant other is at the bedside.  Awake/Alert and oriented x3. No aphasia, follows commands.  Visual field full except left upper and lower quadrant blurriness.  No gaze palsy, face symmetric.  PERRLA, EOMI, full strength throughout.  The patient presented yesterday with blurred vision, nausea, lightheadedness, dizziness and headache.  She stated her symptoms began approx. 9:30 AM on 10/14/2020.  She was looking at her computer and noticed her vision started becoming blurry and also endorsed lightheadedness which was exacerbated by standing up.  Symptoms worsened progressively and she developed a headache around 3:30 PM yesterday.  She was outside the window for tPA and no LVO seen on imaging.  MRI negative for stroke.  Symptoms resolve except for left eye blurriness.  Denies headache, dizziness, lightheadedness.    Vitals:   10/15/20 1936 10/15/20 2344 10/16/20 0340 10/16/20 0835  BP: 128/68 113/61 119/76 118/72  Pulse: 69 63 61 63  Resp: 18 16 18 18   Temp: 97.7 F (36.5 C) 98 F (36.7 C) 97.8 F (36.6 C) 97.7 F (36.5 C)  TempSrc: Oral Oral Oral Oral  SpO2: 98% 98% 98% 99%  Weight:      Height:       CBC:  Recent Labs  Lab 10/14/20 1807 10/15/20 0813  WBC 9.6 7.8  NEUTROABS 4.9  --   HGB 14.1 12.6  HCT 42.8 36.4  MCV 93.2 91.2  PLT 331 293   Basic Metabolic Panel:  Recent Labs  Lab 10/15/20 0813 10/16/20 0414  NA 139 142  K 3.1* 3.8  CL 108 109  CO2 24 24  GLUCOSE 117* 92  BUN 8 10  CREATININE 0.79 0.72  CALCIUM 9.1 9.1   Lipid Panel:  Recent Labs  Lab 10/15/20 0345  CHOL 179  TRIG 290*  HDL 26*  CHOLHDL 6.9  VLDL 58*  LDLCALC 95   HgbA1c:  Recent Labs  Lab 10/15/20 0345  HGBA1C 5.7*   Urine Drug Screen:  Recent Labs  Lab 10/14/20 1920  LABOPIA NONE DETECTED  COCAINSCRNUR NONE DETECTED  LABBENZ NONE DETECTED  AMPHETMU NONE DETECTED  THCU NONE DETECTED  LABBARB NONE DETECTED    Alcohol  Level  Recent Labs  Lab 10/14/20 2157  ETH <10    IMAGING past 24 hours MR BRAIN WO CONTRAST  Result Date: 10/15/2020 CLINICAL DATA:  IMPRESSION: Normal MRI head without contrast. Mild mucosal edema paranasal sinuses.   ECHOCARDIOGRAM COMPLETE  Result Date: 10/15/2020 IMPRESSIONS   1. Left ventricular ejection fraction, by estimation, is 60 to 65%. The left ventricle has normal function. The left ventricle has no regional wall motion abnormalities. There is mild concentric left ventricular hypertrophy. Left ventricular diastolic parameters were normal.   2. Right ventricular systolic function is normal. The right ventricular size is normal. There is normal pulmonary artery systolic pressure.  3. The mitral valve is normal in structure. Trivial mitral valve regurgitation. No evidence of mitral stenosis.   4. Tricuspid valve regurgitation is mild to moderate.   5. The aortic valve is grossly normal. Aortic valve regurgitation is not visualized. No aortic stenosis is present.   6. The inferior vena cava is normal in size with greater than 50% respiratory variability, suggesting right atrial pressure of 3 mmHg. Comparison(s):   10/14/2020 CTA Head/Neck IMPRESSION: 1. Negative for intracranial large vessel occlusion 2. Negative CT head 3. No significant carotid or vertebral artery stenosis in the neck. 4. Fibromuscular dysplasia right internal  carotid artery.  Physical Exam  HEENT-  Normocephalic, no lesions, without obvious abnormality.  Normal external eye and conjunctiva.   Cardiovascular- Regular rate and rythym.  Lungs-no respiratory distress. Abdomen- soft, non-distended, no tenderness. Extremities- Warm, dry and intact Musculoskeletal-right knee tenderness. Skin-warm and dry.  Neurological Examination Mental Status: Alert, oriented. Speech fluent. Able to follow 3 step commands without difficulty. Cranial Nerves: II, III,IV, VI: Blurry visual on the left upper and lower  quadrant on the left eye. Right eye no blurry vision. No nystagmus. Ptosis not present, extra-ocular motions intact bilaterally pupils equal, round, reactive to light and accommodation V,VII: smile symmetric, facial light touch sensation normal bilaterally VIII: hearing normal bilaterally IX,X: uvula rises symmetrically XI: bilateral shoulder shrug XII: midline tongue extension Motor: Right :  Upper extremity   5/5                                      Left:     Upper extremity   5/5             Lower extremity   5/5                                                  Lower extremity   5/5 Tone and bulk:normal tone throughout; no atrophy noted Sensory: Pinprick and light touch intact throughout, bilaterally Deep Tendon Reflexes: 3+ and symmetric patellar, 2+ achilles. Plantars: Right: downgoing                                Left: downgoing Cerebellar: normal finger-to-nose, normal rapid alternating movements and normal heel-to-shin test Gait: deferred   ASSESSMENT/PLAN Ms. [name] is a 55 y.o. female with history of hypertension, hypothyroidism, hyperlipidemia presented with sudden blurry vision, nausea and headache presenting with possible TIA versus complex migraine.   Left eye focal disorder versus complex migraine. Less likely TIA  CTA head & neck: no intracranial LVO, no significant carotid or vertebral artery stenosis in the neck  MRI head  Normal  2D Echo EF 60-65%  LDL 95  HgbA1c 5.7  VTE prophylaxis - Lovenox 40mg  daily  Heart healthy diet  No antithrombotic prior to admission, now on Aspirin 81mg , will continue on Aspirin alone as no evidence of ischemia on MRI head  Follow-up in Neurology clinic in 4 weeks for complex migraine workup  Follow-up with ophthalmologist for vision changes and left eye blurriness  Therapy recommendations:  OT/PT: follow up; supervision - intermittent  Disposition:  Home  Will sign-off for now  Left eye blurry vision  Pt  felt blurry vision at home yesterday, did not check it was one eye or both eyes.  Exam today showed left eye left visual field decreased visual acuity but no vision loss. Right eye visual field intact.   Pt reported blurry vision moving from one object to another object, but need time to adjust and then blurry vision resolved.  Concerning for left eye focal issure - recommend ophthalmology follow up  Possible complicated migraine  Shortly after visual complain, pt had bad HA yesterday.  Currently HA resolved and blurry vision is much improved  Will follow up with [facility]   Hypertension  Home meds:  Lisinopril 10mg  daily, macrobid 100mg  bid, HCTZ 12.5mg  daily  Stable . Follow-up with PCP for possible medication adjustments.  Patient reports having significant purposeful weight loss in the past year.  Since that time she has had more episodes of lightheadedness after taking BP medications.  . Recommend to monitor BP at home . BP goal normotensive  Hyperlipidemia  Home meds:  crestor 20mg  daily, resume in hospitals  LDL 95, goal < 70  Continue current statin at discharge as patient was just placed on crestor prior to admission  Other Stroke Risk Factors  Obesity, Body mass index is 34.34 kg/m., BMI >/= 30 associated with increased stroke risk, recommend weight loss, diet and exercise as appropriate   Family hx stroke (grandfather had a stroke due to hypertension)  Other Active Problems  Hypothyroidism takes syntrhoid daily  Pain: takes Percocet prn at home  Tinnitus and chronic hearing loss  Prior Covid 19 infection 04/2020  Hospital day # 0  Neurology will sign off. Please call with questions. Pt will follow up with [facility] in about 4 weeks. Thanks for the consult.   , MD PhD Stroke Neurology 10/16/2020 6:13 PM   To contact Stroke Continuity provider, please refer to 05/2020. After hours, contact General Neurology

## 2020-10-16 NOTE — Progress Notes (Signed)
Occupational Therapy Treatment Note  L hand appears at baseline. Pt reports continued "blurry" vision but is able to read without difficulty. Pt states her vision has improved since admission. Per neurology note pt with  L field loss on admission. Given change in vision status pt plans to see her eye doctor. Recommend full field visual assessment at that time. Pt verbalized understanding. Educated pt on signs/symptoms of CVA using Be[employer]. No further OT recommended.     10/16/20 1400  OT Visit Information  Last OT Received On 10/16/20  Assistance Needed +1  History of Present Illness 55 y.o. female presenting with worsening headache, dizziness, blurry vision and nausea without emesis. Patient admitted with suspicion for TIA. CT angio head/neck (-) for acute intracranial abnormality. No significant carotid or vertebral artery stenosis noted. Further work-up pending. PMHx significant for HTN, HLD, and thyroid disease.  Precautions  Precautions None  Pain Assessment  Pain Assessment No/denies pain  Cognition  Arousal/Alertness Awake/alert  Behavior During Therapy WFL for tasks assessed/performed  Overall Cognitive Status Within Functional Limits for tasks assessed  ADL  General ADL Comments Pt at [place]  Balance  Overall balance assessment No apparent balance deficits (not formally assessed)  Vision- Assessment  Additional Comments Continues to complain of blurred vision "but better"  Transfers  Overall transfer level Independent  Exercises  Exercises Other exercises  Other Exercises  Other Exercises Educated on fine motor/in-hand manipulation skills; pt reports close to [place]. 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

## 2020-10-16 NOTE — Plan of Care (Signed)
°  Problem: Education: Goal: Knowledge of patient specific risk factors addressed and post discharge goals established will improve Outcome: Adequate for Discharge Goal: Individualized Educational Video(s) Outcome: Adequate for Discharge   Problem: Nutrition: Goal: Dietary intake will improve Outcome: Adequate for Discharge   Problem: Ischemic Stroke/TIA Tissue Perfusion: Goal: Complications of ischemic stroke/TIA will be minimized Outcome: Adequate for Discharge   Problem: Spontaneous Subarachnoid Hemorrhage Tissue Perfusion: Goal: Complications of Spontaneous Subarachnoid Hemorrhage will be minimized Outcome: Adequate for Discharge   Problem: Education: Goal: Knowledge of General Education information will improve Description: Including pain rating scale, medication(s)/side effects and non-pharmacologic comfort measures Outcome: Adequate for Discharge   Problem: Health Behavior/Discharge Planning: Goal: Ability to manage health-related needs will improve Outcome: Adequate for Discharge   Problem: Clinical Measurements: Goal: Ability to maintain clinical measurements within normal limits will improve Outcome: Adequate for Discharge Goal: Will remain free from infection Outcome: Adequate for Discharge Goal: Diagnostic test results will improve Outcome: Adequate for Discharge Goal: Respiratory complications will improve Outcome: Adequate for Discharge Goal: Cardiovascular complication will be avoided Outcome: Adequate for Discharge   Problem: Activity: Goal: Risk for activity intolerance will decrease Outcome: Adequate for Discharge   Problem: Nutrition: Goal: Adequate nutrition will be maintained Outcome: Adequate for Discharge   Problem: Coping: Goal: Level of anxiety will decrease Outcome: Adequate for Discharge   Problem: Elimination: Goal: Will not experience complications related to bowel motility Outcome: Adequate for Discharge Goal: Will not experience  complications related to urinary retention Outcome: Adequate for Discharge   Problem: Pain Managment: Goal: General experience of comfort will improve Outcome: Adequate for Discharge   Problem: Safety: Goal: Ability to remain free from injury will improve Outcome: Adequate for Discharge   Problem: Skin Integrity: Goal: Risk for impaired skin integrity will decrease Outcome: Adequate for Discharge

## 2020-10-16 NOTE — Evaluation (Signed)
Speech Language Pathology Evaluation Patient Details Name: MAVERICK PATMAN MRN: [id number] DOB: 05-13-1966 Today's Date: 10/16/2020 Time: 1000-1026 SLP Time Calculation (min) (ACUTE ONLY): 26 min  Problem List:  Patient Active Problem List   Diagnosis Date Noted  . Hypothyroidism 10/15/2020  . Hyperlipidemia 10/15/2020  . Hypokalemia 10/15/2020  . TIA (transient ischemic attack) 10/14/2020   Past Medical History:  Past Medical History:  Diagnosis Date  . Hypertension   . Thyroid disease    Past Surgical History:  Past Surgical History:  Procedure Laterality Date  . ABDOMINAL HYSTERECTOMY     HPI:  55 y.o. female presenting with worsening headache, dizziness, blurry vision and nausea without emesis. Patient admitted with suspicion for TIA. CT angio head/neck (-) for acute intracranial abnormality. No significant carotid or vertebral artery stenosis noted. Further work-up pending. PMHx significant for HTN, HLD, and thyroid disease. MRI negative for acute changes. SLE requested.   Assessment / Plan / Recommendation Clinical Impression  Cognitive linguistic skills appear WNL for items assessed. Pt scored a 27/30 (normal) on the [facility] and [facility] on other measures. Pt's spouse was present for the evaluation. Pt recalled 4/5 words after 5 minute delay and had difficulty with mental subtraction (eventually self corrected later in the visit). Pt without aphasia, apraxia, dysarthria, or dysphagia. She does report a slight change in her memory skills and questions "COVID brain" following COVID in September 2021 (not vaccinated and did receive MAB). SLP reviewed memory strategies and encouraged her to write more information down and monitor. Her husband does not feel that he has seen any changes. No further SLP services indicated at this time. SLP will sign off.    SLP Assessment  SLP Recommendation/Assessment: Patient does not need any further Speech Lanaguage Pathology Services SLP Visit  Diagnosis: Cognitive communication deficit (R41.841)    Follow Up Recommendations  None    Frequency and Duration           SLP Evaluation Cognition  Overall Cognitive Status: Within Functional Limits for tasks assessed Arousal/Alertness: Awake/alert Orientation Level: Oriented X4 Memory: Appears intact (4/5 recall after 5 minutes) Awareness: Appears intact Problem Solving: Appears intact Executive Function:  (initially had difficulty with mental calculation) Safety/Judgment: Appears intact       Comprehension  Auditory Comprehension Overall Auditory Comprehension: Appears within functional limits for tasks assessed Yes/No Questions: Within Functional Limits Commands: Within Functional Limits Conversation: Complex Visual Recognition/Discrimination Discrimination: Within Function Limits Reading Comprehension Reading Status: Not tested    Expression Expression Primary Mode of Expression: Verbal Verbal Expression Overall Verbal Expression: Appears within functional limits for tasks assessed Initiation: No impairment Level of Generative/Spontaneous Verbalization: Conversation Repetition: No impairment Naming: No impairment Pragmatics: No impairment Non-Verbal Means of Communication: Not applicable Written Expression Dominant Hand: Left Written Expression: Not tested   Oral / Motor  Oral Motor/Sensory Function Overall Oral Motor/Sensory Function: Within functional limits Motor Speech Overall Motor Speech: Appears within functional limits for tasks assessed Respiration: Within functional limits Phonation: Normal Resonance: Within functional limits Articulation: Within functional limitis Intelligibility: Intelligible Motor Planning: Witnin functional limits Motor Speech Errors: Not applicable   Thank you,  Havery Moros, CCC-SLP [phone]                    Golden Gilreath 10/16/2020, 10:44 AM

## 2020-11-13 ENCOUNTER — Encounter: Payer: Self-pay | Admitting: Adult Health

## 2020-11-13 ENCOUNTER — Ambulatory Visit: Payer: Managed Care, Other (non HMO) | Admitting: Adult Health

## 2020-11-13 VITALS — BP 145/92 | HR 80 | Ht 59.0 in | Wt 179.0 lb

## 2020-11-13 DIAGNOSIS — H53453 Other localized visual field defect, bilateral: Secondary | ICD-10-CM

## 2020-11-13 DIAGNOSIS — G459 Transient cerebral ischemic attack, unspecified: Secondary | ICD-10-CM

## 2020-11-13 DIAGNOSIS — R413 Other amnesia: Secondary | ICD-10-CM | POA: Diagnosis not present

## 2020-11-13 NOTE — Progress Notes (Signed)
[facility] [address] [place]. [place] [address] [phone]       [name] Date of Birth:  1966/03/22 Medical Record Number:  [id number]   Reason for Referral:  hospital stroke follow up    SUBJECTIVE:   CHIEF COMPLAINT:  Chief Complaint  Patient presents with  . Follow-up    TR with step daughter(ceecee) Pt is well, having some vision complication but has been to the eye doctor, tends to forget daily things to    HPI:   [name] is a 55 y.o. female with history of hypertension, hypothyroidism, hyperlipidemia who presented to [facility] ED from [place] on 10/14/2020 with sudden onset blurry vision, nausea and headache.  Present reviewed hospitalization pertinent progress notes, lab work and imaging with summary provided.  Evaluated by Dr. Erlinda Hong with stroke work-up largely unremarkable and symptoms likely related to left eye focal disorder vs complex migraine.  Recommended aspirin 81 mg daily as unable to completely rule out TIA.  Possible complicated migraine as shortly after visual complaints bad headache with resolution of headache and blurred vision during Dr. Phoebe Sharps evaluation.  Unable to determine blurred vision just in left eye or possibly both eyes with exam noted left eye visual field decreased visual acuity but no visual loss in right eye visual field intact.  Also reported blurred vision moving from one object to another with increased time needed for adjustment and blurred vision resolved.  Recommended follow-up with ophthalmology with concern of left eye focal issue.  HTN stable.  LDL 95 recently started Crestor 10 mg daily.  Other stroke risk factors include fibromuscular dysplasia of R ICA per CTA neck, obesity and family history of stroke but no prior history of stroke.  Other active problems include hypothyroidism, chronic pain, tinnitus and chronic hearing loss COVID-19 infection 04/2020.  Left  eye focal disorder versus complex migraine. Less likely TIA  CTA head & neck: no intracranial LVO, no significant carotid or vertebral artery stenosis in the neck; noted fibromuscular dysplasia of R ICA  MRI head  Normal  2D Echo EF 60-65%  LDL 95  HgbA1c 5.7  VTE prophylaxis - Lovenox $RemoveB'40mg'YCOLPVbZ$  daily  Heart healthy diet  No antithrombotic prior to admission, now on Aspirin $RemoveBe'81mg'dZnxglwEp$ , will continue on Aspirin alone as no evidence of ischemia on MRI head   Follow-up in Neurology clinic in 4 weeks for complex migraine workup  Follow-up with ophthalmologist for vision changes and left eye blurriness  Therapy recommendations:  OT/PT: follow up; supervision - intermittent  Disposition:  Home   Today, 11/13/2020, [name] is being seen for hospital follow-up accompanied by her daughter Reports residual left peripheral blurred vision but does report improvement since discharge She was seen by optometrist (Dr. [facility]) since discharge and was told that she needs new prescription glasses Has been getting mild headaches while looking at computer screen for too long Denies new stroke/TIA symptoms  Also concerned regarding short term memory loss - will forget what she is supposed to be doing or where she places things - some mild difficulty prior but reports worsening recently.  She has been able to return back to work and maintaining ADLs and IADLs independently.  Compliant on aspirin 81 mg daily and Crestor -denies side effects Blood pressure at home typically 132/68 No further concerns at this time      ROS:   14 system review of systems performed and  negative with exception of visual impairment  PMH:  Past Medical History:  Diagnosis Date  . Hypertension   . Thyroid disease     PSH:  Past Surgical History:  Procedure Laterality Date  . ABDOMINAL HYSTERECTOMY      Social History:  Social History   Socioeconomic History  . Marital status: Married    Spouse  name: Not on file  . Number of children: Not on file  . Years of education: Not on file  . Highest education level: Not on file  Occupational History  . Not on file  Tobacco Use  . Smoking status: Never Smoker  . Smokeless tobacco: Never Used  Vaping Use  . Vaping Use: Never used  Substance and Sexual Activity  . Alcohol use: No  . Drug use: No  . Sexual activity: Not on file  Other Topics Concern  . Not on file  Social History Narrative  . Not on file   Social Determinants of Health   Financial Resource Strain: Not on file  Food Insecurity: Not on file  Transportation Needs: Not on file  Physical Activity: Not on file  Stress: Not on file  Social Connections: Not on file  Intimate Partner Violence: Not on file    Family History: History reviewed. No pertinent family history.  Medications:   Current Outpatient Medications on File Prior to Visit  Medication Sig Dispense Refill  . aspirin EC 81 MG EC tablet Take 1 tablet (81 mg total) by mouth daily. Swallow whole. 30 tablet 11  . Blood Pressure KIT 1 kit by Does not apply route daily as needed (blood pressure check). 1 kit 0  . levothyroxine (SYNTHROID) 75 MCG tablet Take 75 mcg by mouth daily before breakfast.    . naproxen sodium (ALEVE) 220 MG tablet Take 220 mg by mouth.    . rosuvastatin (CRESTOR) 20 MG tablet Take 20 mg by mouth daily.     No current facility-administered medications on file prior to visit.    Allergies:   Allergies  Allergen Reactions  . Penicillins Anaphylaxis  . Codeine Nausea And Vomiting      OBJECTIVE:  Physical Exam  Vitals:   11/13/20 1509  BP: (!) 145/92  Pulse: 80  Weight: 179 lb (81.2 kg)  Height: $Remove'4\' 11"'zJxUWwJ$  (1.499 m)   Body mass index is 36.15 kg/m. No exam data present  General: well developed, well nourished,  pleasant middle-age Caucasian female, seated, in no evident distress Head: head normocephalic and atraumatic.   Neck: supple with no carotid or  supraclavicular bruits Cardiovascular: regular rate and rhythm, no murmurs Musculoskeletal: no deformity Skin:  no rash/petichiae Vascular:  Normal pulses all extremities   Neurologic Exam Mental Status: Awake and fully alert.   Fluent speech and language.  Oriented to place and time. Recent and remote memory intact. Attention span, concentration and fund of knowledge appropriate. Mood and affect appropriate.  Cranial Nerves: Fundoscopic exam difficulty to complete due to small pupil size.  Pupils equal, briskly reactive to light. Extraocular movements full without nystagmus but mild convergence insufficiency. Visual fields full to confrontation although subjective blurriness bilaterally left peripheral upper and lower quadrant. Hearing intact. Facial sensation intact. Face, tongue, palate moves normally and symmetrically.  Motor: Normal bulk and tone. Normal strength in all tested extremity muscles Sensory.: intact to touch , pinprick , position and vibratory sensation.  Coordination: Rapid alternating movements normal in all extremities. Finger-to-nose and heel-to-shin performed accurately bilaterally. Gait and Station: Arises from  chair without difficulty. Stance is normal. Gait demonstrates normal stride length and balance with without use of assistive device. Tandem walk and heel toe without difficulty.  Reflexes: 1+ and symmetric. Toes downgoing.         ASSESSMENT: [name] is a 55 y.o. year old female who presented on 10/14/2020 with sudden onset blurred vision, nausea and headache with stroke work-up largely unremarkable and likely in setting multifocal disorder vs complex migraine and less likely TIA. Vascular risk factors include HTN, HLD, obesity.      PLAN:  1. ?TIA: With recent event, unable to completely rule out TIA. Continue aspirin 81 mg daily  and Crestor  for secondary stroke prevention.  Discussed secondary stroke prevention measures and importance of close PCP  follow up for aggressive stroke risk factor management including HTN with BP goal <130/90 and HLD with LDL goal <70 2. Left peripheral impairment: Bilateral left peripheral blurred vision with some improvement. Noted convergence insufficiency otherwise exam unremarkable.  Subjectively, b/l left peripheral blurred vision.  Request notes be faxed to office from optometrist.  3. Short-term memory loss: Appears to be more in setting of increased stressors with underlying depression and anxiety.  Advised to continue to monitor with importance of stress relaxation techniques.     Follow up in 4 months or call earlier if needed   CC:  [facility] provider: Dr. Leonie Man PCP: Wynn Banker, PA    I spent 45 minutes of face-to-face and non-face-to-face time with patient and daughter.  This included previsit chart review including recent hospitalization pertinent progress notes, lab work and imaging, lab review, study review, order entry, electronic health record documentation, patient education regarding recent hospitalization, continued visual deficit and possible etiologies, importance of managing stroke risk factors and answered all other questions to patient and daughters satisfaction   [name], AGNP-BC  [facility] [address] [place], [place] [address]  Phone [phone] Fax [phone] Note: This document was prepared with digital dictation and possible smart phrase technology. Any transcriptional errors that result from this process are unintentional.

## 2020-11-13 NOTE — Patient Instructions (Addendum)
Continue aspirin 81 mg daily  and Crestor  for secondary stroke prevention  Want to avoid any ibuprofen or aspirin products while on aspirin  Please have eye doctor send information for further review  Okay to return back to driving on a gradual return basis  Continue to follow up with PCP regarding cholesterol and blood pressure management  Maintain strict control of hypertension with blood pressure goal below 130/90 and cholesterol with LDL cholesterol (bad cholesterol) goal below 70 mg/dL.      Followup in the future with me in 4 months or call earlier if needed      Thank you for coming to see [place] at [facility]. I hope we have been able to provide you high quality care today.  You may receive a patient satisfaction survey over the next few weeks. We would appreciate your feedback and comments so that we may continue to improve ourselves and the health of our patients.

## 2020-11-14 ENCOUNTER — Encounter: Payer: Self-pay | Admitting: Adult Health

## 2020-11-14 NOTE — Progress Notes (Signed)
I agree with the above plan 

## 2020-11-21 ENCOUNTER — Telehealth: Payer: Self-pay | Admitting: Adult Health

## 2020-11-21 NOTE — Telephone Encounter (Signed)
Received OV note and exam from [facility] regarding visit completed on 11/15/2020 by Dr. 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

## 2021-03-17 ENCOUNTER — Encounter: Payer: Self-pay | Admitting: Adult Health

## 2021-03-17 ENCOUNTER — Ambulatory Visit: Payer: Managed Care, Other (non HMO) | Admitting: Adult Health

## 2021-03-17 NOTE — Progress Notes (Deleted)
[facility] [address] [place]. [place] [address] [phone]       STROKE FOLLOW UP NOTE  Ms. [name] Date of Birth:  May 13, 1966 Medical Record Number:  [id number]   Reason for Referral: Stroke follow up    SUBJECTIVE:   CHIEF COMPLAINT:  No chief complaint on file.   HPI:   Today, 03/17/2021, [name] returns for 28-monthstroke follow-up.  Stable since prior visit without new stroke/TIA symptoms and reports residual ***.  Compliant on aspirin and Crestor without associated side effects.  Blood pressure today ***.      History provided for reference purposes only Initial visit 11/13/2020 JM: [name] being seen for hospital follow-up accompanied by her daughter Reports residual left peripheral blurred vision but does report improvement since discharge She was seen by optometrist (Dr. [facility]) since discharge and was told that she needs new prescription glasses Has been getting mild headaches while looking at computer screen for too long Denies new stroke/TIA symptoms  Also concerned regarding short term memory loss - will forget what she is supposed to be doing or where she places things - some mild difficulty prior but reports worsening recently.  She has been able to return back to work and maintaining ADLs and IADLs independently.  Compliant on aspirin 81 mg daily and Crestor -denies side effects Blood pressure at home typically 132/68 No further concerns at this time  Stroke admission 10/14/2020 Ms. JAERIELLE STOKLOSAis a 55y.o. female with history of hypertension, hypothyroidism, hyperlipidemia [facility] with sudden onset blurry vision, nausea and headache.  Present reviewed hospitalization pertinent progress notes, lab work and imaging with summary provided.  Evaluated by Dr. XErlinda Hongwith stroke work-up largely unremarkable and symptoms likely related to left eye focal  disorder vs complex migraine.  Recommended aspirin 81 mg daily as unable to completely rule out TIA.  Possible complicated migraine as shortly after visual complaints bad headache with resolution of headache and blurred vision during Dr. XPhoebe Sharpsevaluation.  Unable to determine blurred vision just in left eye or possibly both eyes with exam noted left eye visual field decreased visual acuity but no visual loss in right eye visual field intact.  Also reported blurred vision moving from one object to another with increased time needed for adjustment and blurred vision resolved.  Recommended follow-up with ophthalmology with concern of left eye focal issue.  HTN stable.  LDL 95 recently started Crestor 10 mg daily.  Other stroke risk factors include fibromuscular dysplasia of R ICA per CTA neck, obesity and family history of stroke but no prior history of stroke.  Other active problems include hypothyroidism, chronic pain, tinnitus and chronic hearing loss COVID-19 infection 04/2020.  Left eye focal disorder versus complex migraine. Less likely TIA CTA head & neck: no intracranial LVO, no significant carotid or vertebral artery stenosis in the neck; noted fibromuscular dysplasia of R ICA MRI head  Normal 2D Echo EF 60-65% LDL 95 HgbA1c 5.7 VTE prophylaxis - Lovenox 432mdaily Heart healthy diet No antithrombotic prior to admission, now on Aspirin 8167mwill continue on Aspirin alone as no evidence of ischemia on MRI head  Follow-up in Neurology clinic in 4 weeks for complex migraine workup Follow-up with ophthalmologist for vision changes and left eye blurriness Therapy recommendations:  OT/PT: follow up; supervision - intermittent Disposition:  Home        ROS:  14 system review of systems performed and negative with exception of visual impairment  PMH:  Past Medical History:  Diagnosis Date   Hypertension    Thyroid disease     PSH:  Past Surgical History:  Procedure Laterality Date    ABDOMINAL HYSTERECTOMY      Social History:  Social History   Socioeconomic History   Marital status: Married    Spouse name: Not on file   Number of children: Not on file   Years of education: Not on file   Highest education level: Not on file  Occupational History   Not on file  Tobacco Use   Smoking status: Never   Smokeless tobacco: Never  Vaping Use   Vaping Use: Never used  Substance and Sexual Activity   Alcohol use: No   Drug use: No   Sexual activity: Not on file  Other Topics Concern   Not on file  Social History Narrative   Not on file   Social Determinants of Health   Financial Resource Strain: Not on file  Food Insecurity: Not on file  Transportation Needs: Not on file  Physical Activity: Not on file  Stress: Not on file  Social Connections: Not on file  Intimate Partner Violence: Not on file    Family History: No family history on file.  Medications:   Current Outpatient Medications on File Prior to Visit  Medication Sig Dispense Refill   aspirin 81 MG EC tablet TAKE 1 TABLET (81 MG TOTAL) BY MOUTH DAILY. SWALLOW WHOLE. 30 tablet 11   aspirin EC 81 MG EC tablet Take 1 tablet (81 mg total) by mouth daily. Swallow whole. 30 tablet 11   Blood Pressure KIT 1 kit by Does not apply route daily as needed (blood pressure check). 1 kit 0   levothyroxine (SYNTHROID) 75 MCG tablet Take 75 mcg by mouth daily before breakfast.     lisinopril (ZESTRIL) 10 MG tablet Take 10 mg by mouth daily.     rosuvastatin (CRESTOR) 20 MG tablet Take 20 mg by mouth daily.     No current facility-administered medications on file prior to visit.    Allergies:   Allergies  Allergen Reactions   Penicillins Anaphylaxis   Codeine Nausea And Vomiting      OBJECTIVE:  Physical Exam  There were no vitals filed for this visit.  There is no height or weight on file to calculate BMI. No results found.  General: well developed, well nourished,  pleasant middle-age  Caucasian female, seated, in no evident distress Head: head normocephalic and atraumatic.   Neck: supple with no carotid or supraclavicular bruits Cardiovascular: regular rate and rhythm, no murmurs Musculoskeletal: no deformity Skin:  no rash/petichiae Vascular:  Normal pulses all extremities   Neurologic Exam Mental Status: Awake and fully alert.   Fluent speech and language.  Oriented to place and time. Recent and remote memory intact. Attention span, concentration and fund of knowledge appropriate. Mood and affect appropriate.  Cranial Nerves: Pupils equal, briskly reactive to light. Extraocular movements full without nystagmus but mild convergence insufficiency. Visual fields full to confrontation although subjective blurriness bilaterally left peripheral upper and lower quadrant. Hearing intact. Facial sensation intact. Face, tongue, palate moves normally and symmetrically.  Motor: Normal bulk and tone. Normal strength in all tested extremity muscles Sensory.: intact to touch , pinprick , position and vibratory sensation.  Coordination: Rapid alternating movements normal in all extremities. Finger-to-nose and heel-to-shin performed accurately bilaterally. Gait and Station: [employer]  from chair without difficulty. Stance is normal. Gait demonstrates normal stride length and balance with without use of assistive device. Tandem walk and heel toe without difficulty.  Reflexes: 1+ and symmetric. Toes downgoing.         ASSESSMENT: [name] is a 55 y.o. year old female who presented on 10/14/2020 with sudden onset blurred vision, nausea and headache with stroke work-up largely unremarkable and likely in setting multifocal disorder vs complex migraine and less likely TIA. Vascular risk factors include HTN, HLD, obesity.      PLAN:  ?TIA: With recent event, unable to completely rule out TIA. Continue aspirin 81 mg daily  and Crestor  for secondary stroke prevention.  Discussed  secondary stroke prevention measures and importance of close PCP follow up for aggressive stroke risk factor management including HTN with BP goal <130/90 and HLD with LDL goal <70 Left peripheral impairment: Bilateral left peripheral blurred vision with some improvement. Noted convergence insufficiency otherwise exam unremarkable.  Subjectively, b/l left peripheral blurred vision.  Request notes be faxed to office from optometrist.  Short-term memory loss: Appears to be more in setting of increased stressors with underlying depression and anxiety.  Advised to continue to monitor with importance of stress relaxation techniques.     Follow up in 4 months or call earlier if needed   CC:  [facility] provider: Dr. Leonie Man PCP: Wynn Banker, PA    I spent 45 minutes of face-to-face and non-face-to-face time with patient and daughter.  This included previsit chart, lab review, study review, order entry, electronic health record documentation, patient education regarding recent hospitalization, continued visual deficit and possible etiologies, importance of managing stroke risk factors and answered all other questions to patient and daughters satisfaction  Frann Rider, [facility]  [facility] [address] [place], [place] [address]  Phone [phone] Fax [phone] Note: This document was prepared with digital dictation and possible smart phrase technology. Any transcriptional errors that result from this process are unintentional.

## 2021-05-31 ENCOUNTER — Ambulatory Visit
Admission: RE | Admit: 2021-05-31 | Discharge: 2021-05-31 | Disposition: A | Discharge status: Home / Self Care | Payer: Managed Care, Other (non HMO) | Source: Ambulatory Visit | Attending: Physician Assistant | Admitting: Physician Assistant

## 2021-05-31 ENCOUNTER — Other Ambulatory Visit: Payer: Self-pay | Admitting: Physician Assistant

## 2021-05-31 ENCOUNTER — Other Ambulatory Visit: Payer: Self-pay

## 2021-05-31 DIAGNOSIS — Z0000 Encounter for general adult medical examination without abnormal findings: Secondary | ICD-10-CM

## 2022-02-19 IMAGING — MR MR HEAD W/O CM
9 of 10 series · 36 of 48 positions shown · non-contrast
Comparison: CT angio head and neck 10/14/2020

CLINICAL DATA: TIA. Hypertension hyperlipidemia. Blurred vision
nausea headache.

EXAM:
MRI HEAD WITHOUT CONTRAST
TECHNIQUE: Multiplanar, multiecho pulse sequences of the brain and surrounding
structures were obtained without intravenous contrast.

[Series 3: DWI · axial · 3.0mm · 1.09mm/px · z∈[-105,+23]mm · 8 of 90 slices shown (1 of 4)]
[im 1/90]
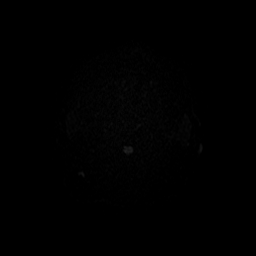
[im 10/90]
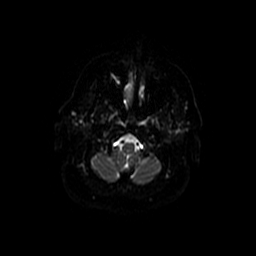
[im 30/90]
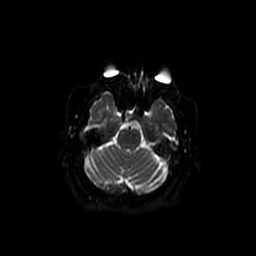
[im 40/90]
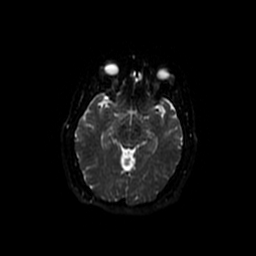
[im 50/90]
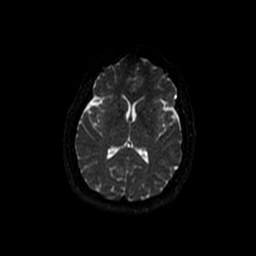
[im 60/90]
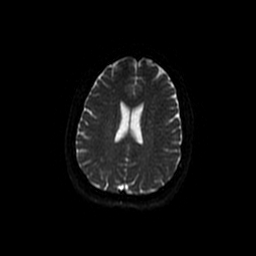
[im 80/90]
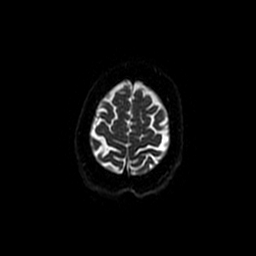
[im 90/90]
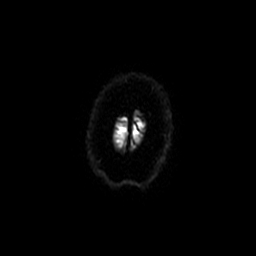

[Series 4: DWI · coronal · 5.0mm · 1.09mm/px · 7 of 66 slices shown (2 of 4)]
[im 1/66]
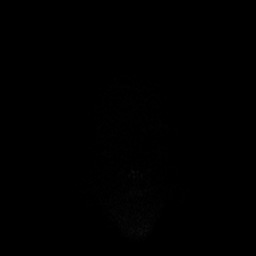
[im 11/66]
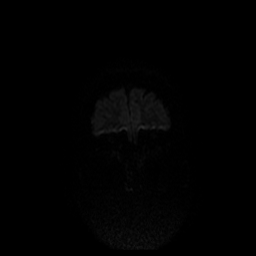
[im 22/66]
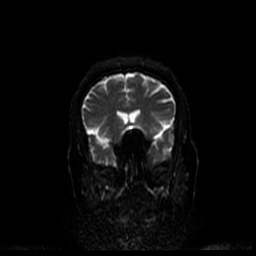
[im 33/66]
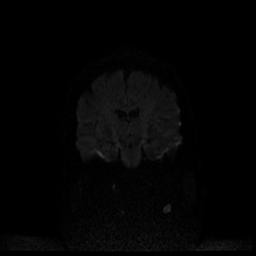
[im 44/66]
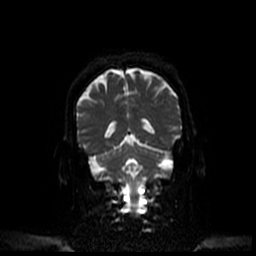
[im 55/66]
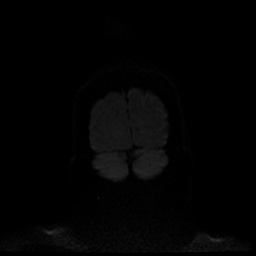
[im 66/66]
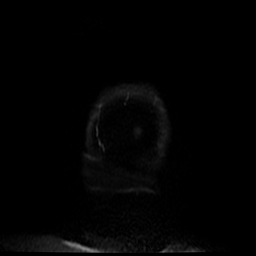

[Series 5: T1 · sagittal · 5.0mm · 0.47mm/px · 2 of 23 slices shown (1 of 2)]
[im 1/23]
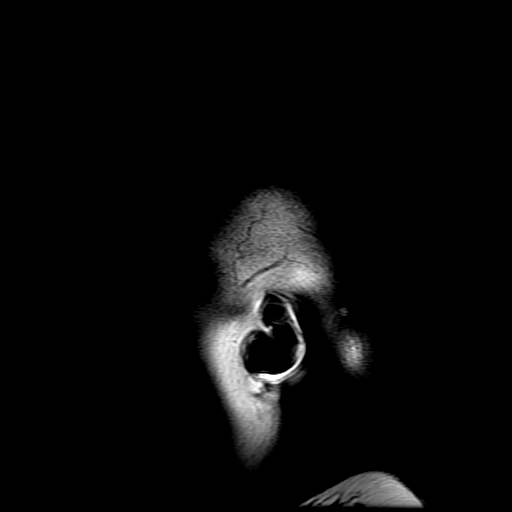
[im 23/23]
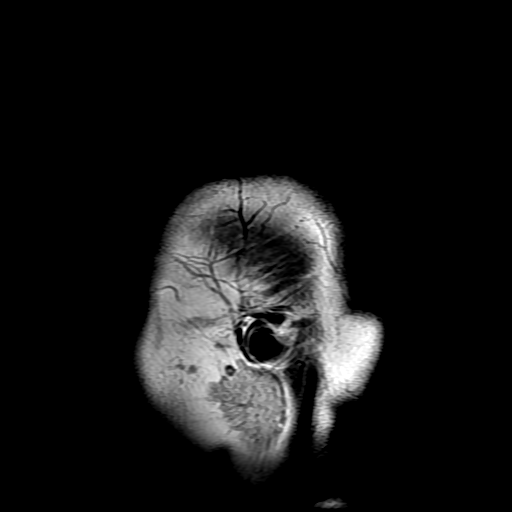

[Series 6: T2 · axial · 5.0mm · 0.43mm/px · z∈[-97,+31]mm · 2 of 23 slices shown (1 of 2)]
[im 1/23]
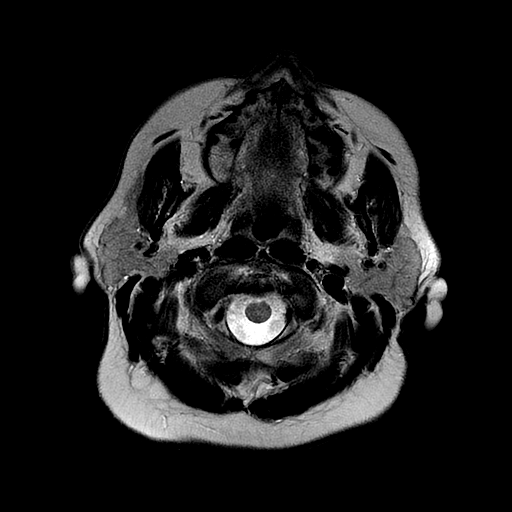
[im 23/23]
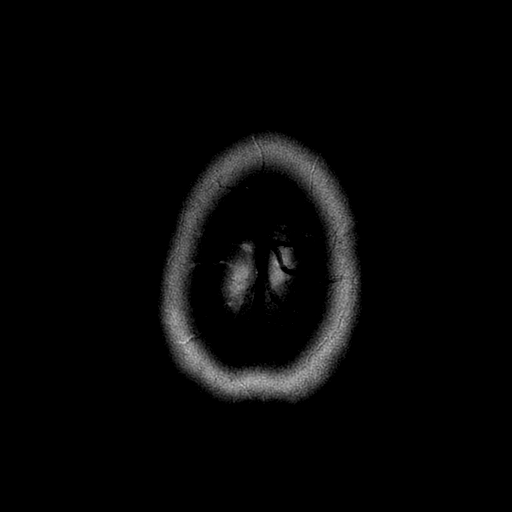

[Series 7: FLAIR · axial · 3.0mm · 0.43mm/px · z∈[-97,+31]mm · 2 of 23 slices shown]
[im 1/23]
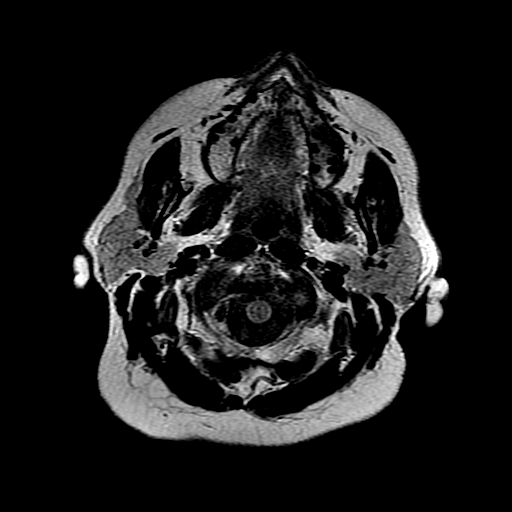
[im 23/23]
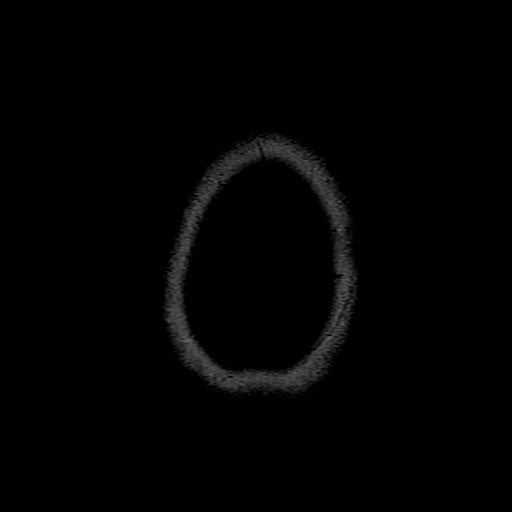

[Series 9: T1 · axial · 3.0mm · 0.47mm/px · z∈[-76,-32]mm · 3 of 100 slices shown (2 of 2)]
[im 1/100]
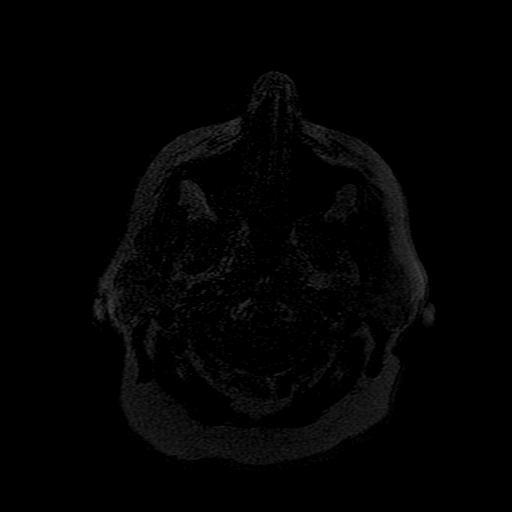
[im 20/100]
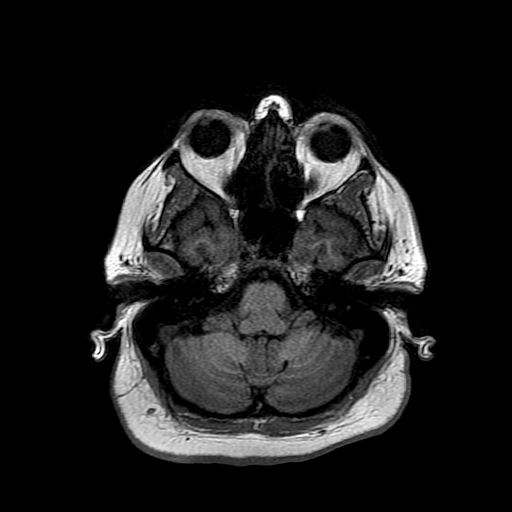
[im 30/100]
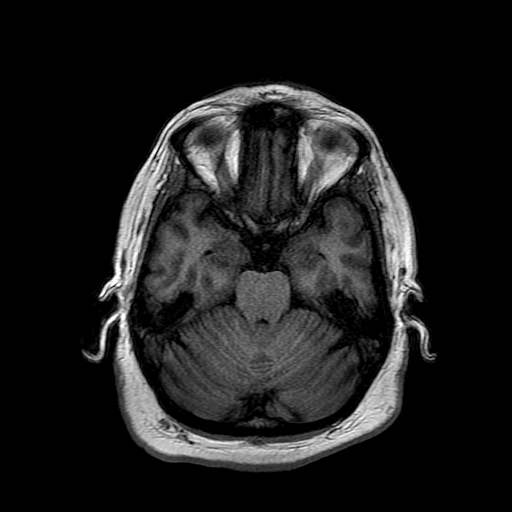

[Series 10: T2 · coronal · 5.0mm · 0.39mm/px · 3 of 25 slices shown (2 of 2)]
[im 1/25]
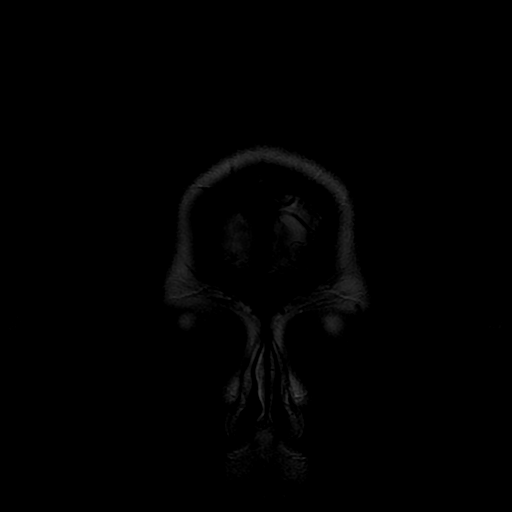
[im 13/25]
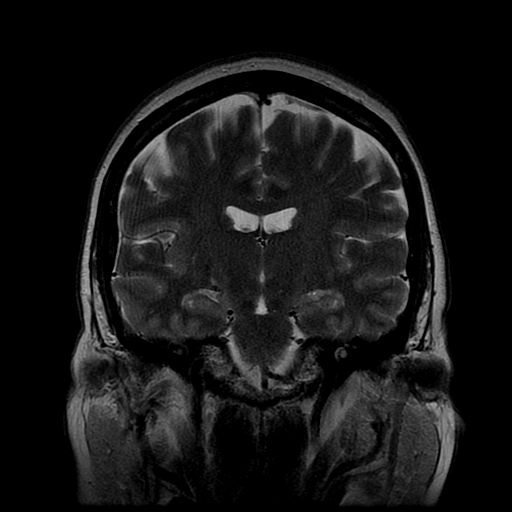
[im 25/25]
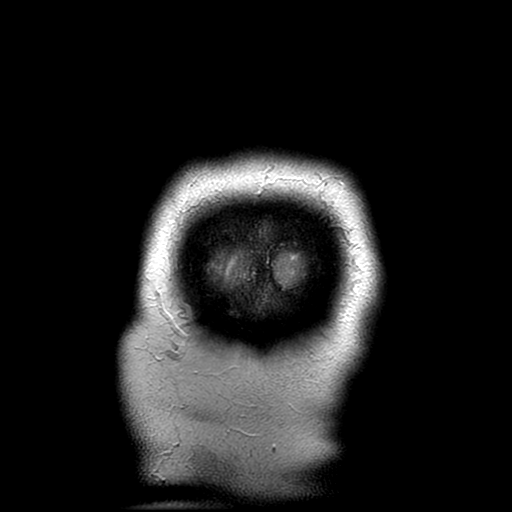

[Series 300: DWI · axial · 3.0mm · 1.09mm/px · z∈[-105,+23]mm · 5 of 45 slices shown (3 of 4)]
[im 1/45]
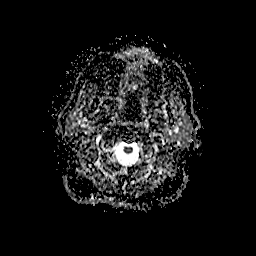
[im 12/45]
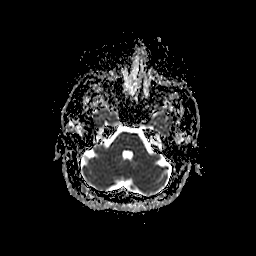
[im 23/45]
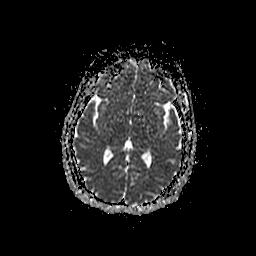
[im 34/45]
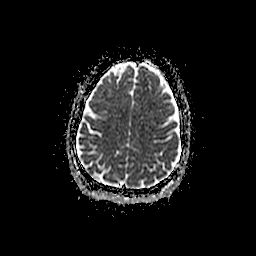
[im 45/45]
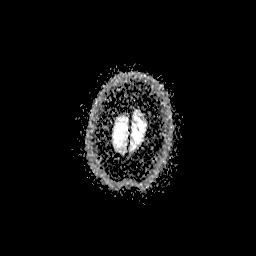

[Series 400: DWI · coronal · 5.0mm · 1.09mm/px · 4 of 33 slices shown (4 of 4)]
[im 1/33]
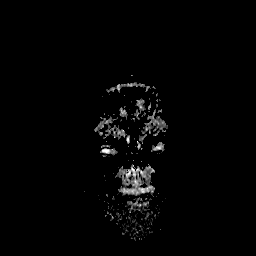
[im 11/33]
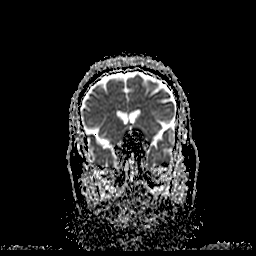
[im 22/33]
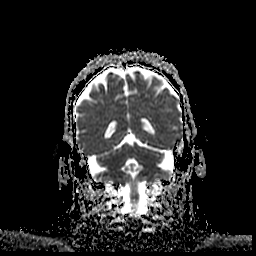
[im 33/33]
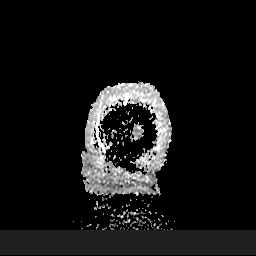

[36 of 48 positions shown; findings below may reference images not displayed]

FINDINGS: Brain: No acute infarction, hemorrhage, hydrocephalus, extra-axial
collection or mass lesion. Normal white matter. No evidence of
demyelinating disease.

Vascular: Normal arterial flow voids.

Skull and upper cervical spine: Negative

Sinuses/Orbits: Mild mucosal edema paranasal sinuses. Negative orbit

Other: None
IMPRESSION: Normal MRI head without contrast.

Mild mucosal edema paranasal sinuses.
